# Patient Record
Sex: Male | Born: 1980 | Race: Black or African American | Hispanic: No | Marital: Single | Smoking: Current every day smoker
Health system: Southern US, Community
[De-identification: ages and names within clinical notes are randomized; demographics above are authoritative.]

## PROBLEM LIST (undated history)

## (undated) DIAGNOSIS — J9819 Other pulmonary collapse: Secondary | ICD-10-CM

## (undated) HISTORY — PX: CHEST TUBE INSERTION: SHX231

---

## 2006-04-01 ENCOUNTER — Emergency Department (HOSPITAL_COMMUNITY): Admission: EM | Admit: 2006-04-01 | Discharge: 2006-04-01 | Payer: Self-pay | Admitting: Emergency Medicine

## 2006-04-02 ENCOUNTER — Inpatient Hospital Stay (HOSPITAL_COMMUNITY): Admission: EM | Admit: 2006-04-02 | Discharge: 2006-04-09 | Payer: Self-pay | Admitting: Emergency Medicine

## 2006-04-05 ENCOUNTER — Encounter (INDEPENDENT_AMBULATORY_CARE_PROVIDER_SITE_OTHER): Payer: Self-pay | Admitting: *Deleted

## 2006-04-07 ENCOUNTER — Encounter: Payer: Self-pay | Admitting: Thoracic Surgery

## 2006-04-23 ENCOUNTER — Encounter: Admission: RE | Admit: 2006-04-23 | Discharge: 2006-04-23 | Payer: Self-pay | Admitting: Thoracic Surgery

## 2007-06-10 IMAGING — CR DG CHEST 1V PORT
1 series · 1 of 1 positions shown · non-contrast
Comparison: 04/02/06.

CLINICAL DATA: Pneumothorax.
 PORTABLE CHEST - 1 VIEW:

[view not recorded]
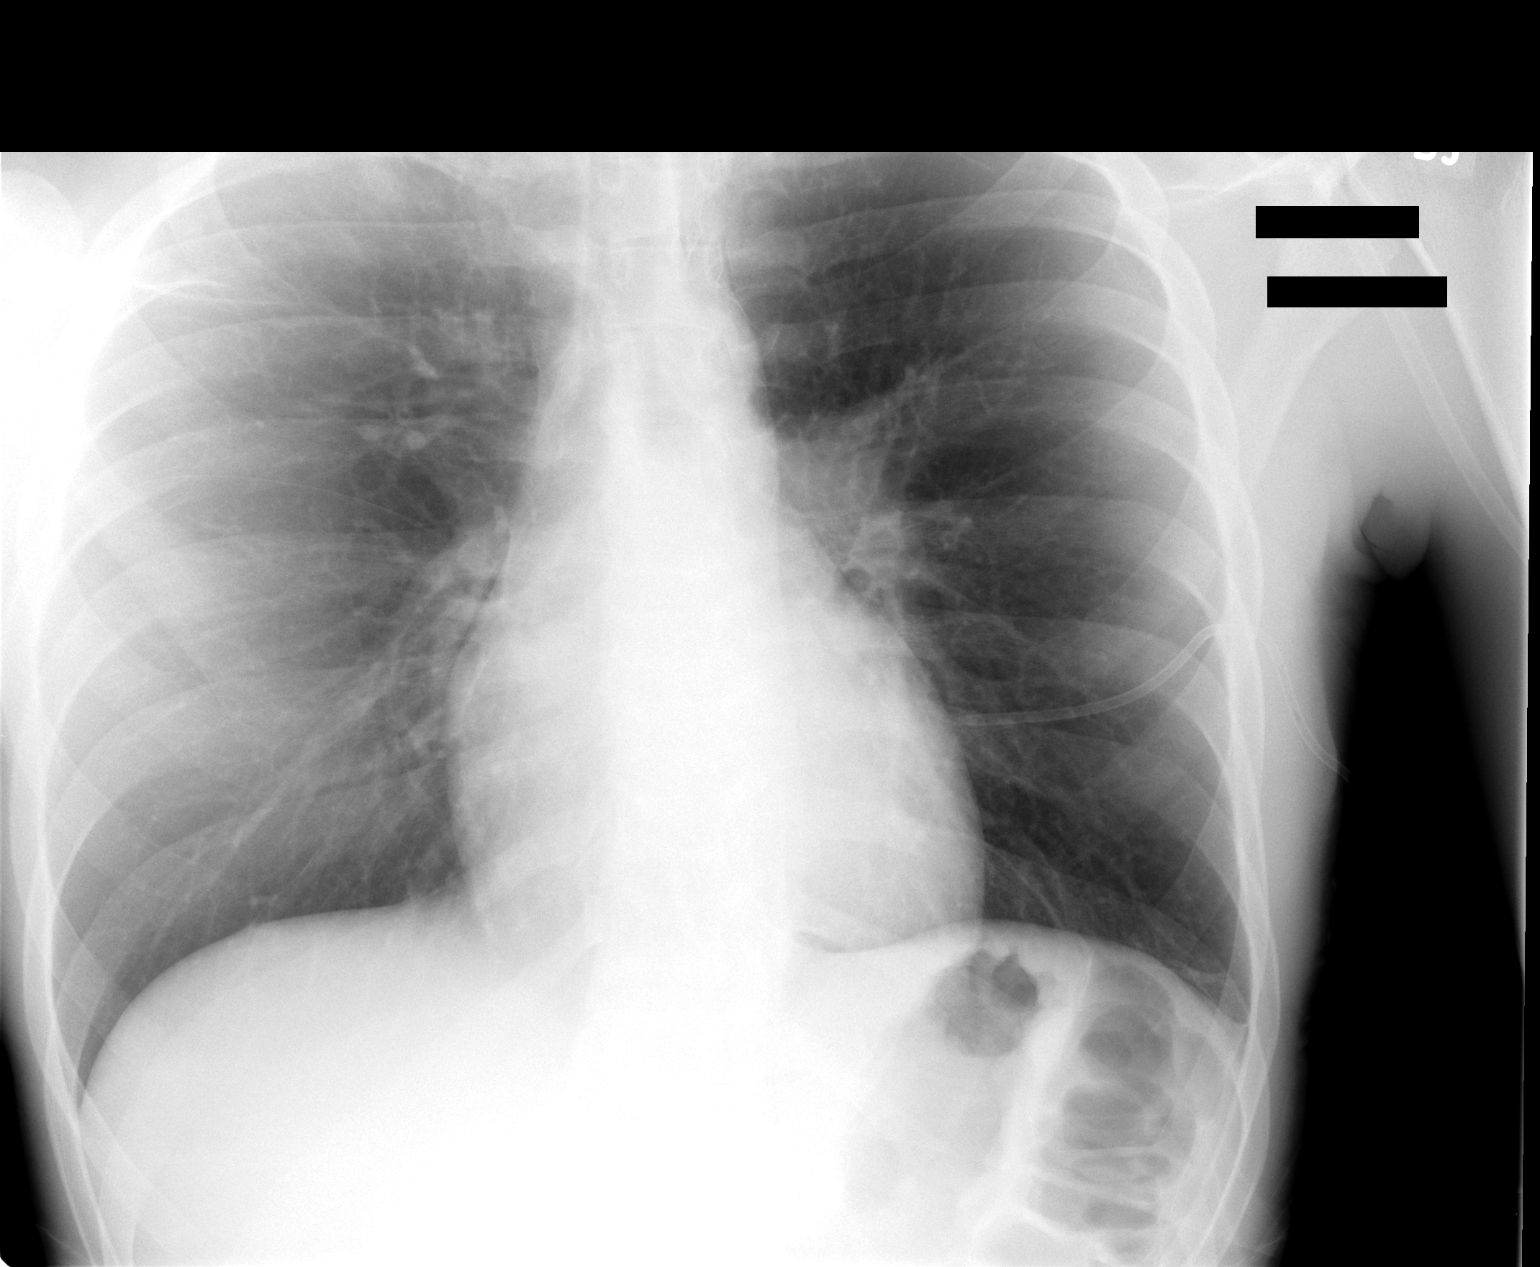

[1 of 1 positions shown; findings below may reference images not displayed]

FINDINGS: 10% left pneumothorax noted.  A small pleural drainage catheter is in place.  Heart and mediastinum appear normal with no evidence of mediastinal shift.  Right lung appears unremarkable.
IMPRESSION: 10% left pneumothorax.

## 2007-06-12 IMAGING — CR DG CHEST 1V PORT
1 series · 1 of 1 positions shown · non-contrast
Comparison: Preoperative exam earlier today.

CLINICAL DATA: Status post VATS. 
 PORTABLE CHEST ? 1 VIEW ? 2577 hours:

[view not recorded]
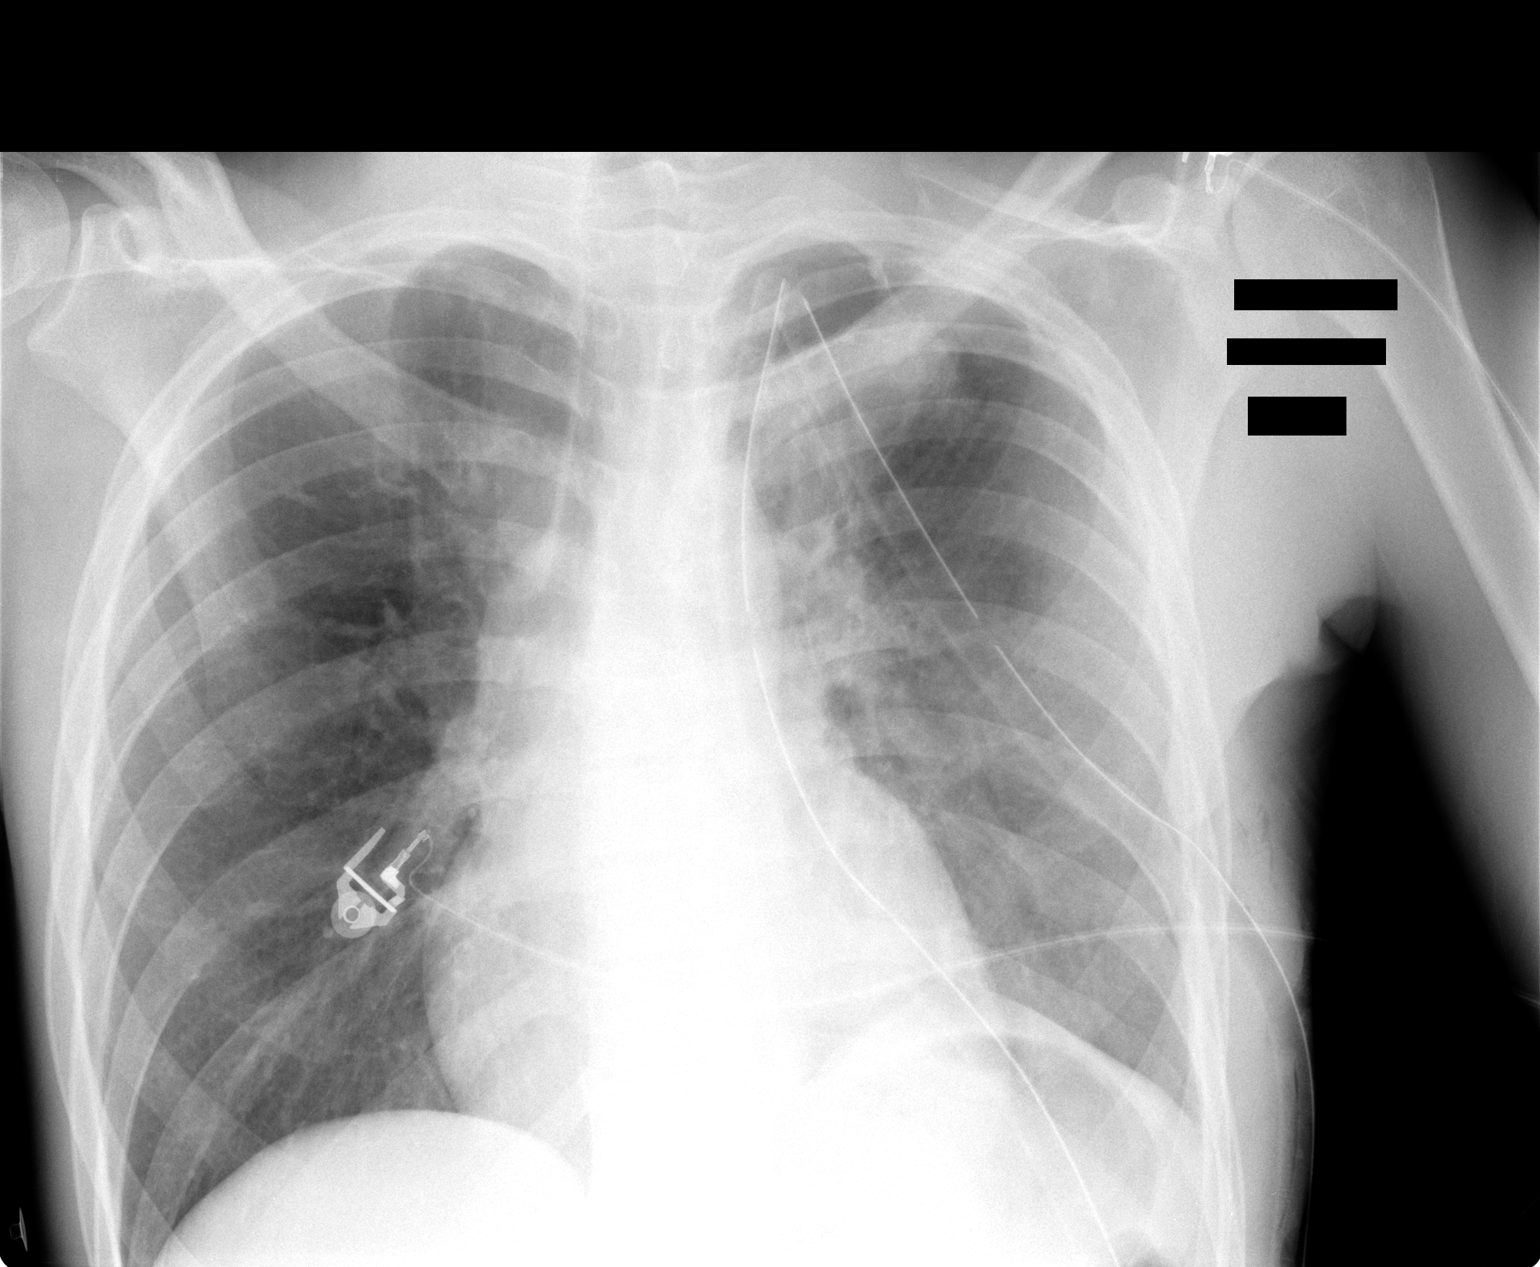

[1 of 1 positions shown; findings below may reference images not displayed]

FINDINGS: Status post left VATS.  Moderately small opacity of the left lung apex is compatible with a postoperative finding.  Possibly a small amount of hemorrhage in the parenchyma.  Two left chest tubes with tips at the apex of the left hemithorax.  No pneumothorax.
IMPRESSION: Status post left VATS.  No pneumothorax.

## 2007-06-15 IMAGING — CR DG CHEST 1V PORT
1 series · 1 of 1 positions shown · non-contrast
Comparison: none

CLINICAL DATA: Evaluate for pneumothorax status-post tube removal. 
PORTABLE CHEST - 1 VIEW ? 04/08/06 (2221 HOURS):

[view not recorded]
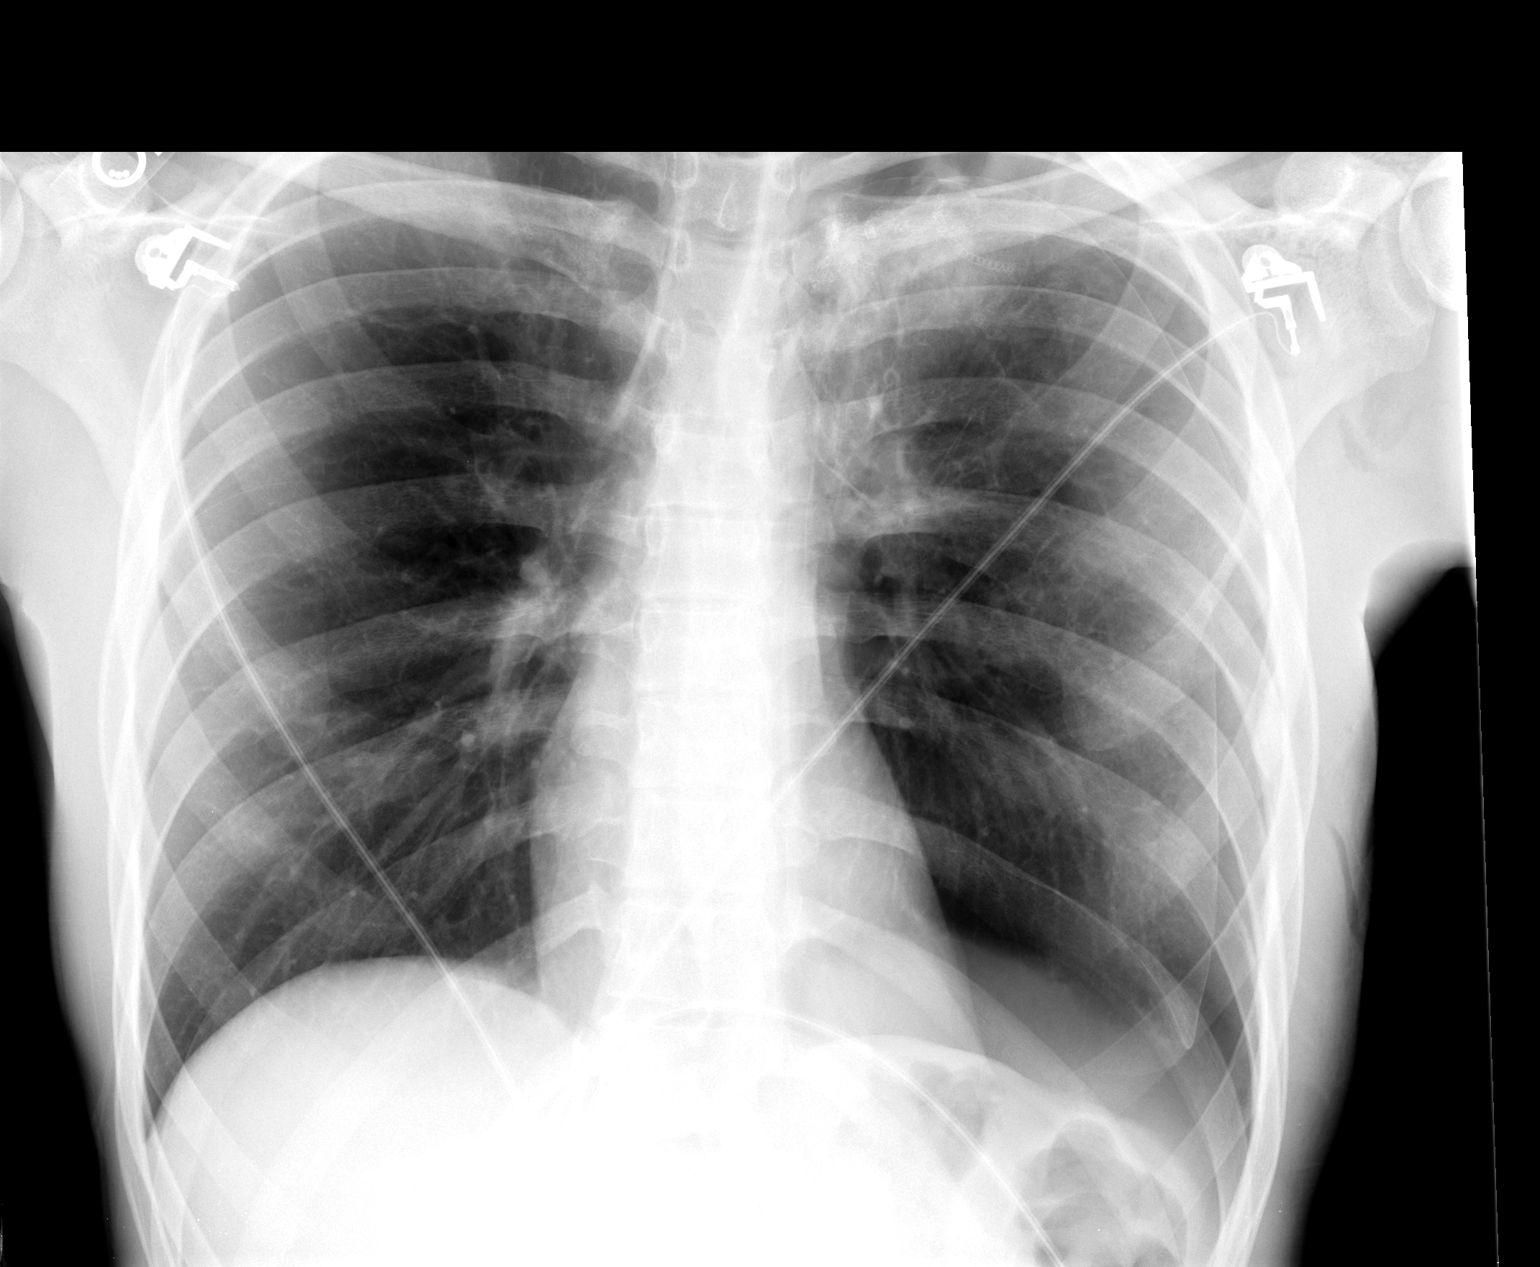

[1 of 1 positions shown; findings below may reference images not displayed]

FINDINGS: Semiupright view of the chest demonstrates removal of the chest tube with a large left pneumothorax.  Postsurgical changes involving the left upper lung region.   The right lung remains clear.  No evidence for a significant mediastinal shift.  Heart and mediastinum are grossly stable.
IMPRESSION: Moderate sized left pneumothorax following removal of the chest tube.  Pneumothorax probably measures 30% of the left hemithorax.  Heart and mediastinum are stable at this time. 
This report was called to 7701.

## 2007-06-15 IMAGING — CR DG CHEST 1V PORT
1 series · 1 of 1 positions shown · non-contrast
Comparison: 04/07/06.

CLINICAL DATA: Follow up left pneumothorax.  Status post left apical blood resection. 
 PORTABLE CHEST ? 1 VIEW:

[view not recorded]
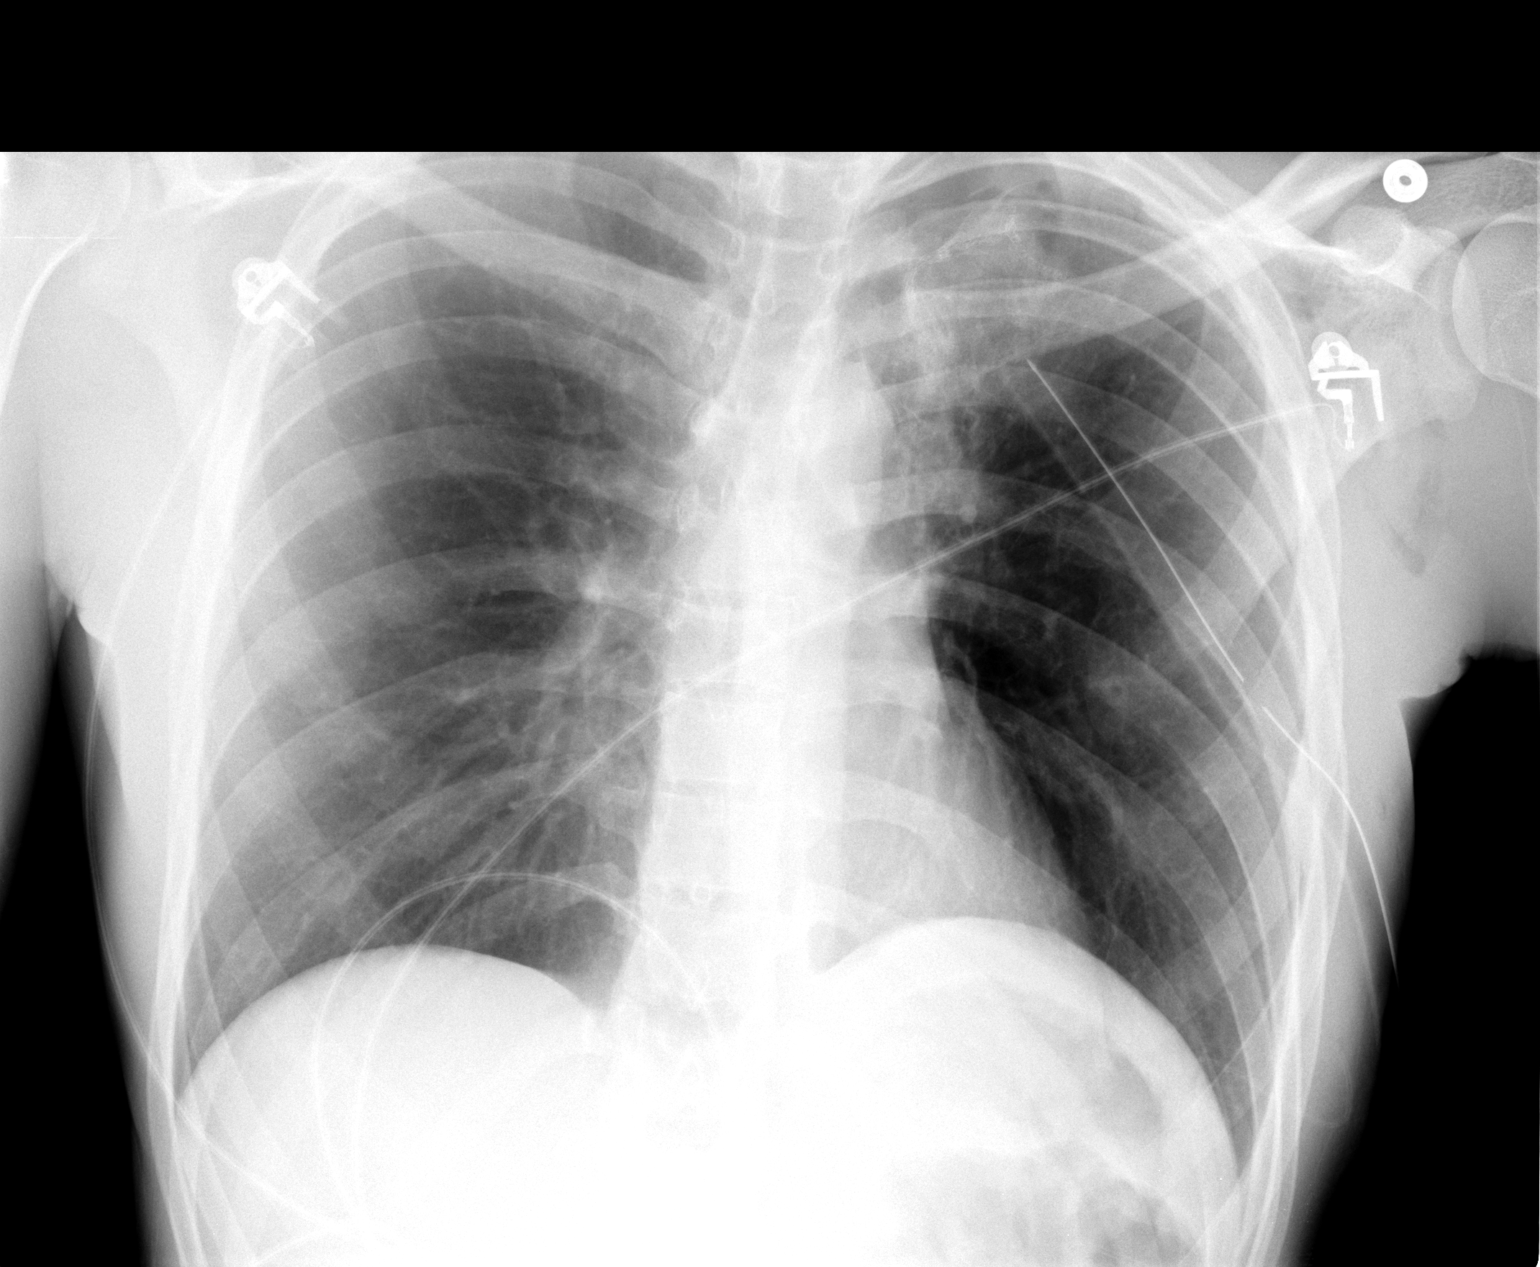

[1 of 1 positions shown; findings below may reference images not displayed]

FINDINGS: Surgical staples are again seen in the left lung apex, and the left chest tube remains in place.  There is a small approximately 5% left apical pneumothorax, which is stable.  Lungs are clear.
IMPRESSION: Stable small approximately 5% left apical pneumothorax.

## 2007-07-07 ENCOUNTER — Emergency Department (HOSPITAL_COMMUNITY): Admission: EM | Admit: 2007-07-07 | Discharge: 2007-07-07 | Payer: Self-pay | Admitting: Emergency Medicine

## 2007-12-15 ENCOUNTER — Emergency Department (HOSPITAL_COMMUNITY): Admission: EM | Admit: 2007-12-15 | Discharge: 2007-12-15 | Payer: Self-pay | Admitting: Emergency Medicine

## 2009-03-21 ENCOUNTER — Emergency Department (HOSPITAL_COMMUNITY): Admission: EM | Admit: 2009-03-21 | Discharge: 2009-03-21 | Payer: Self-pay | Admitting: Emergency Medicine

## 2010-09-24 ENCOUNTER — Emergency Department (HOSPITAL_COMMUNITY)
Admission: EM | Admit: 2010-09-24 | Discharge: 2010-09-24 | Payer: Self-pay | Source: Home / Self Care | Admitting: Emergency Medicine

## 2010-10-15 ENCOUNTER — Encounter: Payer: Self-pay | Admitting: Thoracic Surgery

## 2011-01-01 ENCOUNTER — Emergency Department (HOSPITAL_COMMUNITY)
Admission: EM | Admit: 2011-01-01 | Discharge: 2011-01-01 | Disposition: A | Discharge status: Home / Self Care | Payer: Self-pay | Attending: Emergency Medicine | Admitting: Emergency Medicine

## 2011-01-01 DIAGNOSIS — R059 Cough, unspecified: Secondary | ICD-10-CM | POA: Insufficient documentation

## 2011-01-01 DIAGNOSIS — J309 Allergic rhinitis, unspecified: Secondary | ICD-10-CM | POA: Insufficient documentation

## 2011-01-01 DIAGNOSIS — J3489 Other specified disorders of nose and nasal sinuses: Secondary | ICD-10-CM | POA: Insufficient documentation

## 2011-01-01 DIAGNOSIS — R220 Localized swelling, mass and lump, head: Secondary | ICD-10-CM | POA: Insufficient documentation

## 2011-01-01 DIAGNOSIS — R51 Headache: Secondary | ICD-10-CM | POA: Insufficient documentation

## 2011-01-01 DIAGNOSIS — R5381 Other malaise: Secondary | ICD-10-CM | POA: Insufficient documentation

## 2011-01-01 DIAGNOSIS — R070 Pain in throat: Secondary | ICD-10-CM | POA: Insufficient documentation

## 2011-01-01 DIAGNOSIS — R5383 Other fatigue: Secondary | ICD-10-CM | POA: Insufficient documentation

## 2011-01-01 DIAGNOSIS — R05 Cough: Secondary | ICD-10-CM | POA: Insufficient documentation

## 2011-01-01 DIAGNOSIS — R112 Nausea with vomiting, unspecified: Secondary | ICD-10-CM | POA: Insufficient documentation

## 2011-01-01 DIAGNOSIS — F17200 Nicotine dependence, unspecified, uncomplicated: Secondary | ICD-10-CM | POA: Insufficient documentation

## 2011-02-09 NOTE — Discharge Summary (Signed)
NAMEREGGIE, BISE              ACCOUNT NO.:  [id number]   MEDICAL RECORD NO.:  [id number]          PATIENT TYPE:  INP   LOCATION:  3309                         FACILITY:  [facility]   PHYSICIAN:  Rowe Clack, P.A.-C. DATE OF BIRTH:  [birth year]   DATE OF ADMISSION:  04/04/2006  DATE OF DISCHARGE:  04/09/2006                                 DISCHARGE SUMMARY   HISTORY OF PRESENT ILLNESS:  Patient is a 30 year old male transferred from  [facility] where he presented with a left spontaneous pneumothorax  and had a persistent air leak.  Pain started approximately Monday in the  morning of the week of admission and he presented to the emergency room but  left without being seen secondary to the prolonged wait.  He returned on  Tuesday to the emergency department and was found to have a near complete  left sided pneumothorax.  A chest tube was placed.  He was monitored and had  a persistent air leak and was felt to require the services of  a thoracic  surgeon.  He was then transferred to [facility] under the care of  Dr. Edwyna Shell for further evaluation and treatment.   PAST MEDICAL HISTORY:  None.   PAST SURGICAL HISTORY:  He had a right wrist cyst removed.   ALLERGIES:  No known drug allergies.   MEDICATIONS:  Prior to admission, none.   SOCIAL HISTORY:  Remarkable for tobacco abuse.   PHYSICAL EXAMINATION:  Please see history and physical noted at time of  admission.   HOSPITAL COURSE:  Patient was admitted.  A left video assisted thoracoscopy  was scheduled for stapling of left lung blebs.  This occurred on 04/05/2006  and the following procedure was done:  Left video assisted thoracoscopy with  resection of left apical blebs, pleurectomy and mechanical pleurodesis.  The  procedure was performed by Dr. Edwyna Shell, tolerated well and he was taken to  the post-anesthesia care unit in stable condition.   POSTOPERATIVE HOSPITAL COURSE:  The patient has done well  postoperatively.  His chest tube was discontinued on 04/08/2006.  The chest x-ray is currently  pending.  If it remains stable and he has no recurrence of his pneumothorax  tentatively he will be discharged in the morning of 04/09/2006.  He is  otherwise stable from a pulmonary viewpoint.  Oxygen saturations are 98% on  room air.  He is hemodynamically stable.  His incision related to the chest  tube and video assisted thoracoscopy is showing no evidence of infection.  From a laboratory viewpoint, the patient's hemoglobin hematocrit on  04/07/2006 are 12.7 and 38.2 respectively.  Electrolytes on same day were  sodium 133, potassium 3.8, chloride 97, CO2 29, glucose 106, BUN 4,  creatinine 1.0.   INSTRUCTIONS:  The patient received written instructions in regards to  medications, activity, diet, wound care and followup.  Followup with Dr.  Edwyna Shell on Wednesday July 25th at 4 p.m. with a chest x-ray from [facility].  Medications on discharge:  Nicotine patch as needed, for  pain Tylox one every 6  hours prn.   FINAL DIAGNOSES:  Spontaneous left pneumothorax, now status post bleb  resection and pleurodesis.  Other diagnosis as previously listed per the  history.  Condition on discharge is stable and improved.      Rowe Clack, P.A.-C.     Sherryll Burger  D:  04/08/2006  T:  04/09/2006  Job:  [id number]   cc:   Ines Bloomer, M.D.  [address]  [place]  [place] [address]

## 2011-02-09 NOTE — Consult Note (Signed)
Andrew Mccarty, MONFORTE NO.:  [id number]   MEDICAL RECORD NO.:  [id number]          PATIENT TYPE:  OBV   LOCATION:  A327                          FACILITY:  [facility]   PHYSICIAN:  Barbaraann Barthel, M.D. DATE OF BIRTH:  [birth year]   DATE OF CONSULTATION:  DATE OF DISCHARGE:                                   CONSULTATION   NOTE:  Surgery was asked to see this 30 year old black male who was seen in  the emergency room for left-sided pneumothorax.   HISTORY OF PRESENT ILLNESS:  The patient states that he had sudden onset of  left-sided chest pain yesterday.  He came to the emergency room.  He did not  wait to be attended.  He left and then returned to the emergency room the  following day with continued left-sided pain.  He was admitted into the  emergency room.  He was seen by the emergency room physician where a 10-  French chest tube was connected to a Heimlich valve and then to a pleural  vac into wall suction was installed.  A follow-up chest x-ray showed that  the left lung had almost completely reexpanded with a 10% residual  pneumothorax.  Surgery was consulted to monitor his admission as he is not  being admitted by the hospitalist for observation.   CONTRIBUTORY HISTORY:  This is the first episode of spontaneous  pneumothorax.  This is unassociated with trauma.  The patient smokes at  least a half a pack of cigarettes per day.   PAST MEDICAL HISTORY:  The patient takes no medications on a regular basis.  He has had surgery on his right wrist.  He has no known allergies.   PHYSICAL EXAMINATION:  VITAL SIGNS:  He is approximately 6 feet 4, weighs  158 pounds.  Temperature is 98.1, blood pressure 118/80, pulse rate 87,  respirations 26.  His O2 sat oximetry is 100%.  HEAD:  Normocephalic.  EYES:  Extraocular movements intact.  Pupils were round, react to light and  accommodation.  There is no conjunctive pallor or scleral injection.  Sclerae are normal  tincture.  NOSE:  Normal mucosa are moist.  NECK:  Supple and cylindrical without jugular vein distension, thyromegaly  or tracheal deviation.  His neck has no adenopathy.  CHEST:  Breath sounds are now appreciated bilaterally.  The patient has a  chest tube well placed and secured to the left side.  ABDOMEN:  Soft.  RECTAL:  Examination was deferred.  EXTREMITIES:  Within normal limits, status post right wrist surgery.   PLAN:  This patient will be admitted to my service for observation.  We will  admit him to wall suction, obtain a follow-up chest x-ray in the morning and  observing for any acute changes.      Barbaraann Barthel, M.D.  Electronically Signed     WB/MEDQ  D:  04/02/2006  T:  04/02/2006  Job:  [id number]

## 2011-02-09 NOTE — Op Note (Signed)
[name], Andrew Mccarty              ACCOUNT NO.:  [id number]   MEDICAL RECORD NO.:  [id number]          PATIENT TYPE:  INP   LOCATION:  3309                         FACILITY:  [facility]   PHYSICIAN:  Ines Bloomer, M.D. DATE OF BIRTH:  [birth year]   DATE OF PROCEDURE:  DATE OF DISCHARGE:                                 OPERATIVE REPORT   PREOPERATIVE DIAGNOSIS:  Recurrent left pneumothorax.   POSTOPERATIVE DIAGNOSIS:  Recurrent left pneumothorax.   OPERATION PERFORMED:  Left VAT resection of apical blebs, pleurectomy and  pleurodesis.   SURGEON:  D.P. Burney.   ASSESSMENT:  [name], PA-C.   ANESTHESIA:  General anesthesia.   After percutaneous insertion of all monitor lines, the patient underwent  general anesthesia, was turned to the left lateral thoracotomy position.  The dual-lumen tube was inserted.  He was prepped and draped in the usual  sterile manner.  Three trocar sites were made.  One in the 6th intercostal  space at the posterior axillary line, one in the 5th intercostal space at  the anterior axillary line, and one in the 3rd intercostal space at the  midaxillary line.  Three trocars were inserted.  The zero-degree scope was  inserted, and the patient had multiple blebs apex of his lungs.  This was  resected using the Auto Suture, green stapler, both 45 and 60 staplers  resecting a good portion of the left upper lobe to removal all of the blebs.  Then a pleurectomy was performed using Kaiserling forceps, stripping the  pleura from the apex down to about the 5th or 6th ribs medially, laterally  and superiorly.  And then a pleurodesis using a Bovie scraper was done for  the lower 5-6 ribs in a circumferential fashion.  Two chest tubes were  inserted through the lower trocar sites and tied in place with 0 silk.  The  superior trocar site was closed with 3-0 Vicryl and Dermabond for the skin.  The lung was reexpanded.  The patient is returned to the  recovery room in  stable condition.           ______________________________  Ines Bloomer, M.D.     DPB/MEDQ  D:  04/05/2006  T:  04/06/2006  Job:  [id number]   cc:   Barbaraann Barthel, M.D.  Fax: [phone]

## 2011-02-09 NOTE — Discharge Summary (Signed)
NAMEFUE, CERVENKA              ACCOUNT NO.:  [id number]   MEDICAL RECORD NO.:  [id number]          PATIENT TYPE:  INP   LOCATION:  A327                          FACILITY:  [facility]   PHYSICIAN:  Barbaraann Barthel, M.D. DATE OF BIRTH:  August 31, 1981   DATE OF ADMISSION:  04/03/2006  DATE OF DISCHARGE:  07/12/2007LH                                 DISCHARGE SUMMARY   DIAGNOSIS:  One hundred percent left-sided pneumothorax.   NOTE:  This is a 30 year old black male who is a smoker, he smokes a pack of  cigarettes per day, who had his first episode of a spontaneous pneumothorax  unassociated with trauma or any particularly vigorous physical activity.  He  represented to the emergency room where a Heimlich catheter chest tube was  placed in his left pleural cavity with resolution of his pneumothorax to  approximately 10%, and he seemed to be doing well; however, he continued to  have an air leak, and when this was taken from wall suction, the  pneumothorax increased to 25%.  I returned him to wall suction and  transferred this patient to Dr. Dennie Bible Burney's service after discussing the  above with him.   LABORATORY DATA:  His white count on admission was 5.9 with an H&H of 14.6  and 43.4.  His electrolytes were grossly within normal limits.  The patient  will be transferred by [facility], or whatever arrangements Dr. Edwyna Shell  chooses.  I have written orders for him to have an IV of normal saline at 75  cc an hour, and his Pleur-Evac connected to suction at continuous suction.  Transfer arrangements are in progress.      Barbaraann Barthel, M.D.  Electronically Signed     WB/MEDQ  D:  04/04/2006  T:  04/04/2006  Job:  [id number]   cc:   Alvin Critchley, M.D.  [address]  [place], [place] [address]   Ines Bloomer, M.D.  [address]  [place]  [place] [address]

## 2013-11-11 ENCOUNTER — Encounter (HOSPITAL_COMMUNITY): Payer: Self-pay | Admitting: Emergency Medicine

## 2013-11-11 ENCOUNTER — Emergency Department (HOSPITAL_COMMUNITY)
Admission: EM | Admit: 2013-11-11 | Discharge: 2013-11-11 | Disposition: A | Discharge status: Home / Self Care | Payer: Self-pay | Attending: Emergency Medicine | Admitting: Emergency Medicine

## 2013-11-11 ENCOUNTER — Emergency Department (HOSPITAL_COMMUNITY): Payer: Self-pay

## 2013-11-11 DIAGNOSIS — R071 Chest pain on breathing: Secondary | ICD-10-CM | POA: Insufficient documentation

## 2013-11-11 DIAGNOSIS — R0781 Pleurodynia: Secondary | ICD-10-CM

## 2013-11-11 DIAGNOSIS — Z8709 Personal history of other diseases of the respiratory system: Secondary | ICD-10-CM | POA: Insufficient documentation

## 2013-11-11 DIAGNOSIS — F17200 Nicotine dependence, unspecified, uncomplicated: Secondary | ICD-10-CM | POA: Insufficient documentation

## 2013-11-11 HISTORY — DX: Other pulmonary collapse: J98.19

## 2013-11-11 MED ORDER — IBUPROFEN 800 MG PO TABS: 800.0000 mg | ORAL_TABLET | Freq: Three times a day (TID) | ORAL | Status: DC

## 2013-11-11 NOTE — ED Provider Notes (Signed)
CSN: [id number]     Arrival date & time 11/11/13  1435 History   First MD Initiated Contact with Patient 11/11/13 1651     Chief Complaint  Patient presents with  . Chest Pain     (Consider location/radiation/quality/duration/timing/severity/associated sxs/prior Treatment) Patient is a 33 y.o. male presenting with chest pain.  Chest Pain  Pt with remote history of spontaneous pneumothorax reports onset of moderate sharp/aching L lower chest pains earlier today while at work, not associated with SOB but worse with deep breath and improved with raising L arm. No fever but has had URI symptoms recently.   Past Medical History  Diagnosis Date  . Collapsed lung    Past Surgical History  Procedure Laterality Date  . Chest tube insertion     No family history on file. History  Substance Use Topics  . Smoking status: Current Every Day Smoker  . Smokeless tobacco: Not on file  . Alcohol Use: Yes     Comment: beer every other day    Review of Systems  Cardiovascular: Positive for chest pain.   All other systems reviewed and are negative except as noted in HPI.     Allergies  Morphine and related  Home Medications  No current outpatient prescriptions on file. BP 121/85  Pulse 70  Temp(Src) 98.1 F (36.7 C) (Oral)  Resp 18  Ht 6\' 4"  (1.93 m)  Wt 155 lb (70.308 kg)  BMI 18.88 kg/m2  SpO2 100% Physical Exam  Nursing note and vitals reviewed. Constitutional: He is oriented to person, place, and time. He appears well-developed and well-nourished.  HENT:  Head: Normocephalic and atraumatic.  Eyes: EOM are normal. Pupils are equal, round, and reactive to light.  Neck: Normal range of motion. Neck supple.  Cardiovascular: Normal rate, normal heart sounds and intact distal pulses.   Pulmonary/Chest: Effort normal and breath sounds normal. No respiratory distress. He has no wheezes. He has no rales. He exhibits tenderness (L lower).  Abdominal: Bowel sounds are normal. He  exhibits no distension. There is no tenderness.  Musculoskeletal: Normal range of motion. He exhibits no edema and no tenderness.  Neurological: He is alert and oriented to person, place, and time. He has normal strength. No cranial nerve deficit or sensory deficit.  Skin: Skin is warm and dry. No rash noted.  Psychiatric: He has a normal mood and affect.    ED Course  Procedures (including critical care time) Labs Review Labs Reviewed - No data to display Imaging Review Dg Chest 2 View  11/11/2013   CLINICAL DATA:  Left-sided chest pain for 2 hr.  EXAM: CHEST  2 VIEW  COMPARISON:  09/24/2010 rib radiographs  FINDINGS: The cardiomediastinal silhouette is within normal limits. Suture material overlies the left lung apex, unchanged. The lungs are well inflated without evidence of focal airspace consolidation, edema, pleural effusion, or pneumothorax. No acute osseous abnormality is identified.  IMPRESSION: No evidence of acute abnormality in the chest.   Electronically Signed   By: Sebastian Ache   On: 11/11/2013 15:30    EKG Interpretation    Date/Time:  Wednesday November 11 2013 14:37:27 EST Ventricular Rate:  64 PR Interval:  172 QRS Duration: 84 QT Interval:  396 QTC Calculation: 408 R Axis:   80 Text Interpretation:  Normal sinus rhythm Possible Left atrial enlargement ST elevation, consider early repolarization, pericarditis, or injury Abnormal ECG No previous ECGs available Confirmed by Akera Snowberger  MD, Amyr Sluder ([id number]) on 11/11/2013 4:51:31 PM  MDM   Final diagnoses:  Pleuritic chest pain    Xray reviewed, neg for PTX. Lung sounds clear, no cardiac rub, could be mild pericarditis given EKG findings, but patient is tall and thin, so early repol is more likely. Either way, will d/c with NSAID, PCP followup and return for worsening.     Golden Gilreath B. Bernette MayersSheldon, MD 11/11/13 [id number]

## 2013-11-11 NOTE — Discharge Instructions (Signed)

## 2013-11-11 NOTE — ED Notes (Signed)
Pt reports sudden onset of chest pain since approx 1315.  Reports history of collapsed lung.  Breath sounds auscultated.  Denies any SOB.  Pt says pain gets better when he raises his left arm.

## 2015-01-18 IMAGING — CR DG CHEST 2V
2 series · 2 of 2 positions shown · non-contrast
Comparison: 09/24/2010 rib radiographs

CLINICAL DATA: Left-sided chest pain for 2 hr.

EXAM:
CHEST  2 VIEW

[view not recorded (1 of 2)]
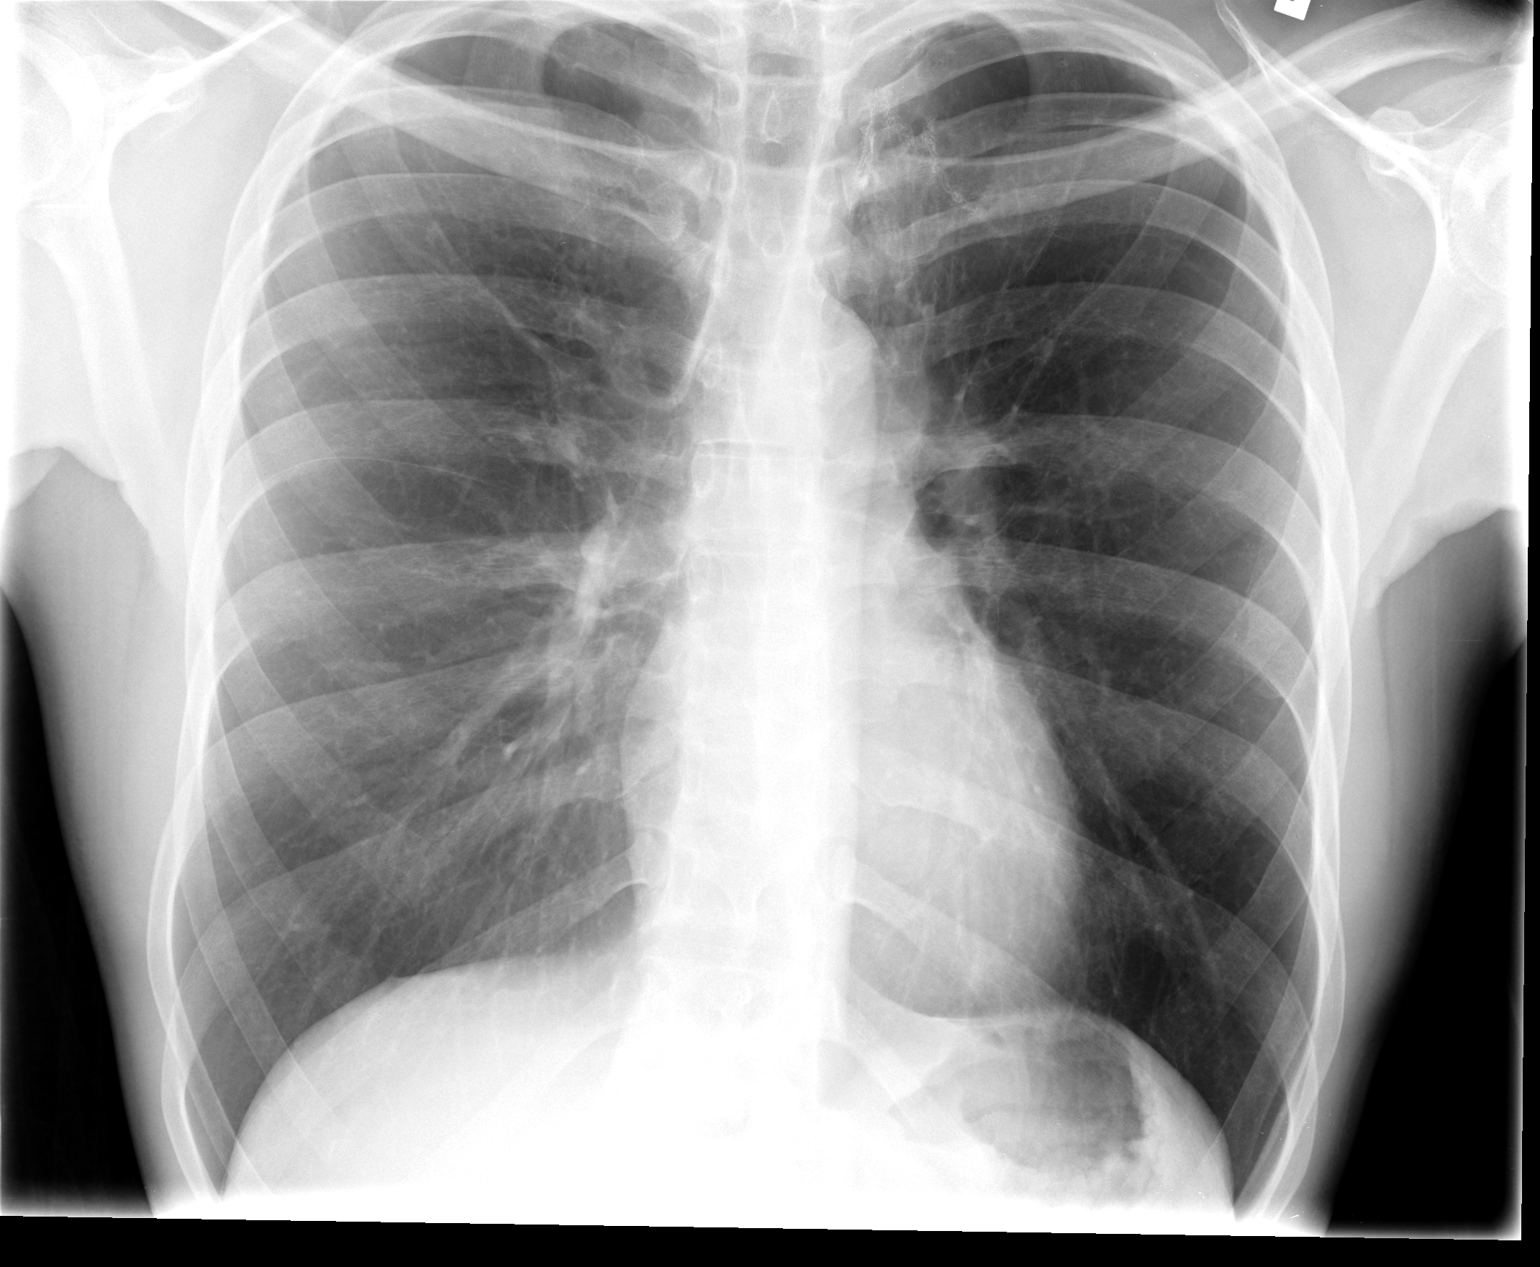

[view not recorded (2 of 2)]
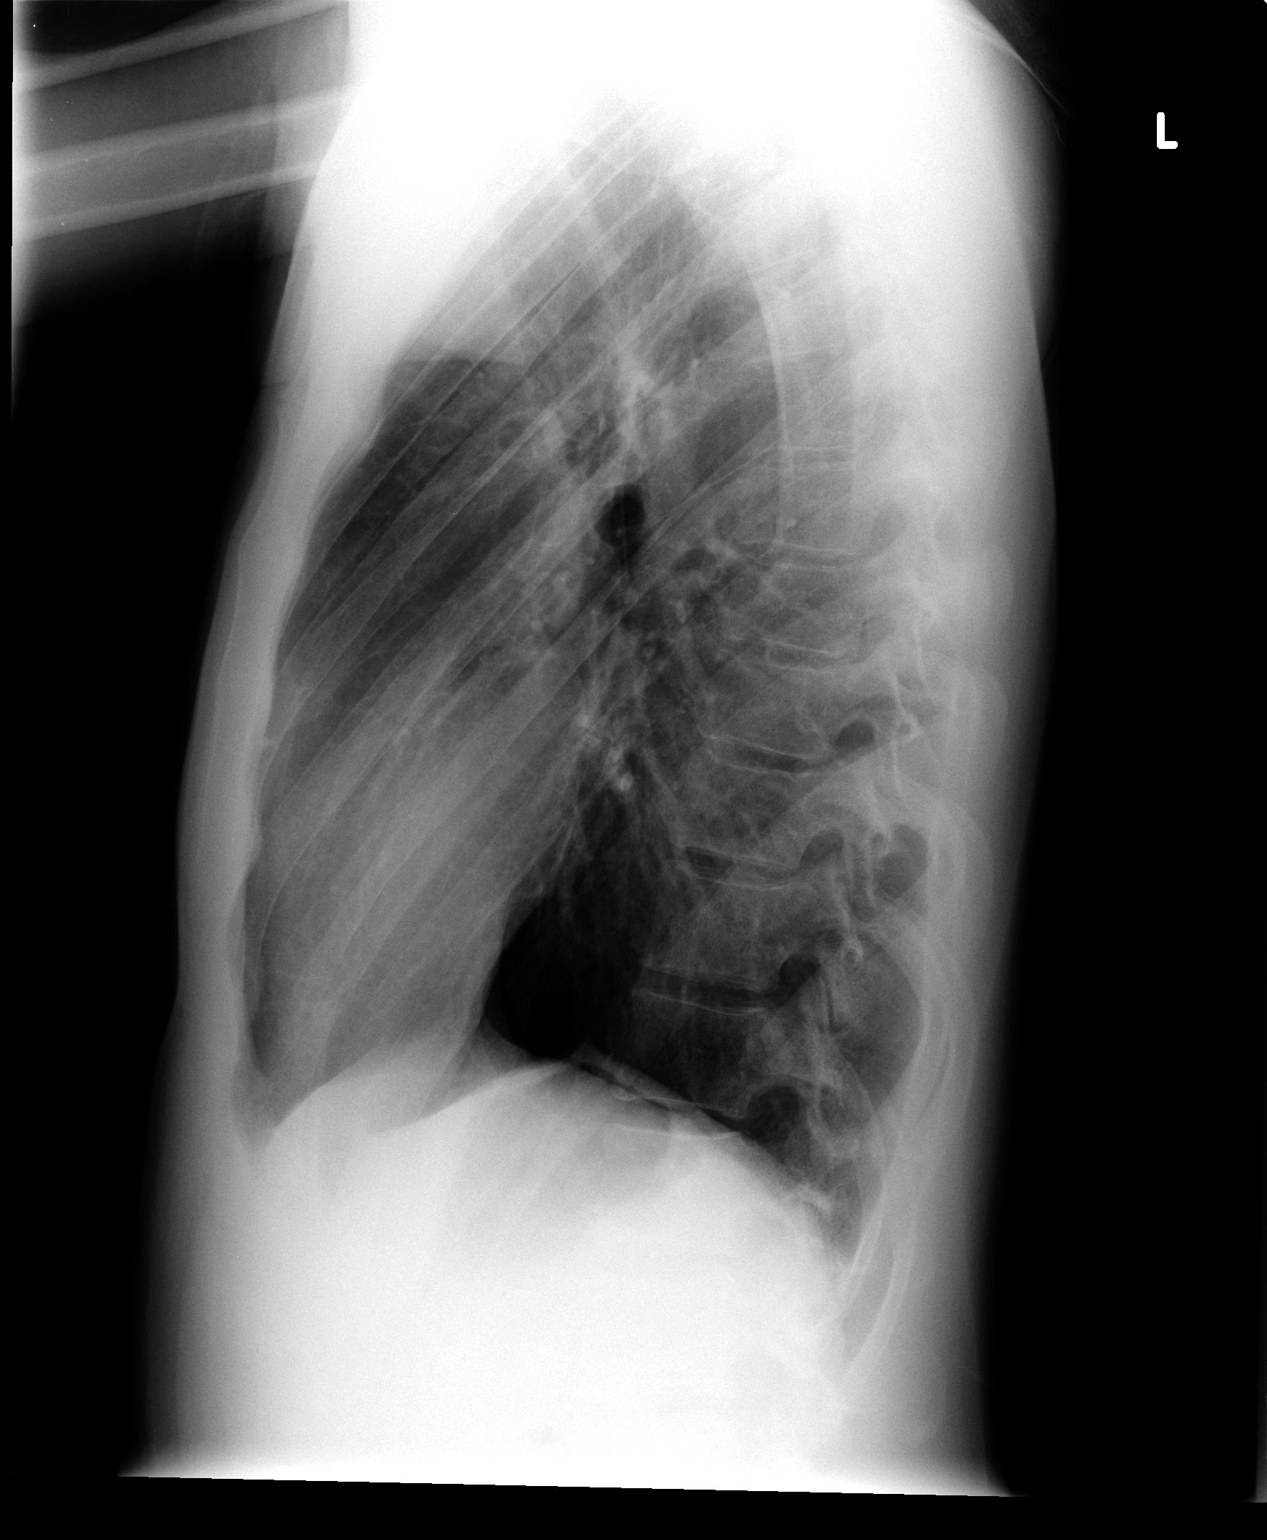

[2 of 2 positions shown; findings below may reference images not displayed]

FINDINGS: The cardiomediastinal silhouette is within normal limits. Suture
material overlies the left lung apex, unchanged. The lungs are well
inflated without evidence of focal airspace consolidation, edema,
pleural effusion, or pneumothorax. No acute osseous abnormality is
identified.
IMPRESSION: No evidence of acute abnormality in the chest.

## 2017-03-29 ENCOUNTER — Emergency Department (HOSPITAL_COMMUNITY)
Admission: EM | Admit: 2017-03-29 | Discharge: 2017-03-29 | Disposition: A | Discharge status: Home / Self Care | Payer: Self-pay | Attending: Emergency Medicine | Admitting: Emergency Medicine

## 2017-03-29 ENCOUNTER — Encounter (HOSPITAL_COMMUNITY): Payer: Self-pay | Admitting: Emergency Medicine

## 2017-03-29 DIAGNOSIS — Y939 Activity, unspecified: Secondary | ICD-10-CM | POA: Insufficient documentation

## 2017-03-29 DIAGNOSIS — W228XXA Striking against or struck by other objects, initial encounter: Secondary | ICD-10-CM | POA: Insufficient documentation

## 2017-03-29 DIAGNOSIS — Y999 Unspecified external cause status: Secondary | ICD-10-CM | POA: Insufficient documentation

## 2017-03-29 DIAGNOSIS — Y929 Unspecified place or not applicable: Secondary | ICD-10-CM | POA: Insufficient documentation

## 2017-03-29 DIAGNOSIS — F17200 Nicotine dependence, unspecified, uncomplicated: Secondary | ICD-10-CM | POA: Insufficient documentation

## 2017-03-29 DIAGNOSIS — Z885 Allergy status to narcotic agent status: Secondary | ICD-10-CM | POA: Insufficient documentation

## 2017-03-29 DIAGNOSIS — S81801A Unspecified open wound, right lower leg, initial encounter: Secondary | ICD-10-CM | POA: Insufficient documentation

## 2017-03-29 LAB — CBG MONITORING, ED: GLUCOSE-CAPILLARY: 86 mg/dL (ref 65–99)

## 2017-03-29 MED ORDER — BACITRACIN ZINC 500 UNIT/GM EX OINT
TOPICAL_OINTMENT | CUTANEOUS | Status: AC
  Filled 2017-03-29: qty 0.9

## 2017-03-29 NOTE — ED Provider Notes (Signed)
[facility]ER[facility] DEPT Provider Note   CSN: [id number] Arrival date & time: 03/29/17  1641     History   Chief Complaint Chief Complaint  Patient presents with  . Wound Infection    HPI Andrew Mccarty is a 36 y.o. male with no significant past medical history who presents today with chief complaint  wound to the right lower extremity for 2 months. He states that he was hit by a metal chair to his right shin 2 months ago, and he has had a wound to this area since then. Notes that he has cleaned it, and will apply antibiotic ointment daily. He notes that this area has "scabbed over "twice, with subsequent "rubbing off "of the scab after use of antibiotic ointment or while wearing his work uniform. He denies pain, tenderness, malodorous or abnormal drainage. States that the area will occasionally drains serosanguinous fluid. He denies numbness, tingling, or weakness. States that he is not up-to-date on his tetanus, but refuses tetanus vaccine today. Notes that he smokes approximately 10 cigarettes daily. States that his mother has a history of diabetes.   The history is provided by the patient.    Past Medical History:  Diagnosis Date  . Collapsed lung     There are no active problems to display for this patient.   Past Surgical History:  Procedure Laterality Date  . CHEST TUBE INSERTION         Home Medications    Prior to Admission medications   Medication Sig Start Date End Date Taking? Authorizing Provider  ibuprofen (ADVIL,MOTRIN) 800 MG tablet Take 1 tablet (800 mg total) by mouth 3 (three) times daily. 11/11/13   Susy Frizzle, MD    Family History History reviewed. No pertinent family history.  Social History Social History  Substance Use Topics  . Smoking status: Current Every Day Smoker  . Smokeless tobacco: Never Used  . Alcohol use Yes     Comment: beer every other day     Allergies   Morphine and related   Review of Systems Review of Systems    Constitutional: Negative for chills and fever.  Musculoskeletal: Negative for arthralgias and myalgias.  Skin: Positive for wound.  Neurological: Negative for weakness and numbness.     Physical Exam Updated Vital Signs BP (!) 134/102 (BP Location: Right Arm)   Pulse 90   Temp 98.2 F (36.8 C) (Oral)   Resp 16   Ht 6\' 4"  (1.93 m)   Wt 70.3 kg (155 lb)   SpO2 100%   BMI 18.87 kg/m   Physical Exam  Constitutional: He appears well-developed and well-nourished. No distress.  HENT:  Head: Normocephalic and atraumatic.  Eyes: Conjunctivae are normal. Right eye exhibits no discharge. Left eye exhibits no discharge.  Neck: No JVD present. No tracheal deviation present.  Cardiovascular: Normal rate and intact distal pulses.   2+ DP/PT pulses bl, negative Homan's bl   Pulmonary/Chest: Effort normal.  Abdominal: He exhibits no distension.  Musculoskeletal: Normal range of motion. He exhibits no edema or tenderness.  Neurological: He is alert. No sensory deficit.  Fluent speech, no facial droop, sensation intact to soft touch of BLE, normal gait, and patient able to heel walk and toe walk without difficulty.   Skin: Skin is warm and dry. Capillary refill takes less than 2 seconds. He is not diaphoretic.  3 cm linear wound to the right anterior shin. Healthy pink tissue centrally. No drainage, induration, purulence, fluctuance, or surrounding erythema. No  tenderness to palpation.  Psychiatric: He has a normal mood and affect. His behavior is normal.     ED Treatments / Results  Labs (all labs ordered are listed, but only abnormal results are displayed) Labs Reviewed  CBG MONITORING, ED    EKG  EKG Interpretation None       Radiology No results found.  Procedures Procedures (including critical care time)  Medications Ordered in ED Medications  bacitracin 500 UNIT/GM ointment (not administered)     Initial Impression / Assessment and Plan / ED Course  I have  reviewed the triage vital signs and the nursing notes.  Pertinent labs & imaging results that were available during my care of the patient were reviewed by me and considered in my medical decision making (see chart for details).     Patient with chronic wound of right lower extremity for 2 months. Afebrile, vital signs are stable. Neurovascularly intact. No pain noted on examination. No deformity or crepitus noted. Low suspicion of osteomyelitis, necrotizing fasciitis, cellulitis, or superimposed skin infection. Suspect repeated trauma and smoking resulting in delayed wound healing. Discussed proper wound care, and recommend follow-up with primary care physician for reevaluation in 2 weeks. Patient is adamant that he does not want his tetanus updated or imaging obtained. I feel this is reasonable at this time given physical examination is on concerning for infectious pathology, fracture, or dislocation. Discussed indications for return to the ED immediately. Pt and patient's girlfriend verbalized understanding of and agreement with plan and patient is safe for discharge home at this time.   Final Clinical Impressions(s) / ED Diagnoses   Final diagnoses:  Wound of lower extremity, right, initial encounter    New Prescriptions Discharge Medication List as of 03/29/2017  5:23 PM       Jeanie SewerFawze, Marlee Armenteros A, PA-C 03/29/17 1749    Eber HongMiller, Brian, MD 03/30/17 [phone]

## 2017-03-29 NOTE — Discharge Instructions (Signed)
Apply bacitracin cream daily. Keep the wound clean and covered. Follow up with a primary care physician for reevaluation in 2 weeks. Stop smoking, as this will impair wound healing. Return to the ED immediately if any concerning signs or symptoms develop such as fever, chills, redness, abnormal drainage.

## 2017-03-29 NOTE — ED Triage Notes (Addendum)
Patient states he hit his left lower leg 2 months ago. Complaining of open wound that has worsened since injury. States "my mama had diabetes and I'm scared I might have it and that's why it won't heal up." CBG 86 in triage.

## 2018-05-11 ENCOUNTER — Emergency Department (HOSPITAL_COMMUNITY)
Admission: EM | Admit: 2018-05-11 | Discharge: 2018-05-11 | Disposition: A | Discharge status: Home / Self Care | Payer: Self-pay | Attending: Emergency Medicine | Admitting: Emergency Medicine

## 2018-05-11 ENCOUNTER — Encounter (HOSPITAL_COMMUNITY): Payer: Self-pay | Admitting: *Deleted

## 2018-05-11 ENCOUNTER — Other Ambulatory Visit: Payer: Self-pay

## 2018-05-11 DIAGNOSIS — K0889 Other specified disorders of teeth and supporting structures: Secondary | ICD-10-CM | POA: Insufficient documentation

## 2018-05-11 DIAGNOSIS — Z5321 Procedure and treatment not carried out due to patient leaving prior to being seen by health care provider: Secondary | ICD-10-CM | POA: Insufficient documentation

## 2018-05-11 NOTE — ED Triage Notes (Signed)
Pt c/o lower left dental pain x 2 days. Denies fever.

## 2018-05-13 NOTE — ED Notes (Signed)
Follow up call made  No answer  05/13/18  [id number] s Claudine Stallings rn

## 2021-09-30 ENCOUNTER — Emergency Department (HOSPITAL_COMMUNITY): Payer: Self-pay

## 2021-09-30 ENCOUNTER — Emergency Department (HOSPITAL_COMMUNITY)
Admission: EM | Admit: 2021-09-30 | Discharge: 2021-09-30 | Disposition: A | Discharge status: Home / Self Care | Payer: Self-pay | Attending: Emergency Medicine | Admitting: Emergency Medicine

## 2021-09-30 ENCOUNTER — Encounter (HOSPITAL_COMMUNITY): Payer: Self-pay

## 2021-09-30 DIAGNOSIS — E119 Type 2 diabetes mellitus without complications: Secondary | ICD-10-CM | POA: Insufficient documentation

## 2021-09-30 DIAGNOSIS — Z7982 Long term (current) use of aspirin: Secondary | ICD-10-CM | POA: Insufficient documentation

## 2021-09-30 DIAGNOSIS — M940 Chondrocostal junction syndrome [Tietze]: Secondary | ICD-10-CM | POA: Insufficient documentation

## 2021-09-30 DIAGNOSIS — I119 Hypertensive heart disease without heart failure: Secondary | ICD-10-CM | POA: Insufficient documentation

## 2021-09-30 DIAGNOSIS — I2510 Atherosclerotic heart disease of native coronary artery without angina pectoris: Secondary | ICD-10-CM | POA: Insufficient documentation

## 2021-09-30 LAB — CBC
HCT: 38.4 % — ABNORMAL LOW (ref 39.0–52.0)
Hemoglobin: 12.8 g/dL — ABNORMAL LOW (ref 13.0–17.0)
MCH: 30 pg (ref 26.0–34.0)
MCHC: 33.3 g/dL (ref 30.0–36.0)
MCV: 90.1 fL (ref 80.0–100.0)
Platelets: 266 10*3/uL (ref 150–400)
RBC: 4.26 MIL/uL (ref 4.22–5.81)
RDW: 13.8 % (ref 11.5–15.5)
WBC: 11.4 10*3/uL — ABNORMAL HIGH (ref 4.0–10.5)
nRBC: 0 % (ref 0.0–0.2)

## 2021-09-30 LAB — BASIC METABOLIC PANEL
Anion gap: 9 (ref 5–15)
BUN: <5 mg/dL — ABNORMAL LOW (ref 6–20)
CO2: 28 mmol/L (ref 22–32)
Calcium: 9.1 mg/dL (ref 8.9–10.3)
Chloride: 100 mmol/L (ref 98–111)
Creatinine, Ser: 0.73 mg/dL (ref 0.61–1.24)
GFR, Estimated: >60 mL/min (ref >60)
Glucose, Bld: 68 mg/dL — ABNORMAL LOW (ref 70–99)
Potassium: 4.8 mmol/L (ref 3.5–5.1)
Sodium: 137 mmol/L (ref 135–145)

## 2021-09-30 MED ORDER — IBUPROFEN 800 MG PO TABS
800.0000 mg | ORAL_TABLET | Freq: Once | ORAL | Status: AC
  Administered 2021-09-30: 800 mg via ORAL
  Filled 2021-09-30: qty 1

## 2021-09-30 NOTE — ED Notes (Signed)
Patient trembling after blood draw. Patient states "I'm terrified of needles and I shouldn't have looked." Patient comforted. Patient's bed repositioned and TV turned on for distraction.EDPA aware of patient's phobia of needles and notified lab drew blood before IV access gained. Per EDPa no IV access needed at this time.

## 2021-09-30 NOTE — Discharge Instructions (Signed)
Was a pleasure taking care of you today!  Your chest x-ray did not show pneumonia or pneumothorax.  Your labs did not show infection.  You may take over-the-counter 600 mg ibuprofen every 6 hours as needed for pain.  You may apply ice or heat to the affected area for up to 15 minutes at a time.  Ensure to place a barrier between your skin and the ice/heat.  If you do not have a primary care provider, you may follow-up with [facility] and [facility] as needed.  You may return to the ED if you are experiencing increasing/worsening chest pain at rest, trouble breathing at rest, worsening symptoms.

## 2021-09-30 NOTE — ED Provider Notes (Signed)
[facility] EMERGENCY DEPARTMENT Provider Note   CSN: [id number] Arrival date & time: 09/30/21  1000     History  Chief Complaint  Patient presents with   Chest Pain    RIGHT SIDE    Andrew Mccarty is a 41 y.o. male with a past medical history of pneumothorax (left side, 20 years ago) who presents to the ED complaining of right-sided chest pain onset this morning.  Patient notes that his right-sided chest pain was gradual in nature.  He works at [employer].  Burgess Estelle he was working on his moped and did a lot of bending over prior to the onset of his symptoms.  He has tried aspirin with no relief of his symptoms.  Denies shortness of breath, fever, chills, nasal congestion, rhinorrhea, abdominal pain, nausea, vomiting.  Patient still has his gallbladder.  Denies history of MI, CAD, catheterization, hypertension, diabetes.  The history is provided by the patient. No language interpreter was used.  Chest Pain Pain location:  R chest Pain radiates to:  Does not radiate Pain severity:  Mild Onset quality:  Gradual Timing:  Intermittent Progression:  Unchanged Chronicity:  New Context comment:  Deep breathing, coughing, laughing Relieved by:  Nothing Worsened by:  Coughing and deep breathing Ineffective treatments:  Aspirin Associated symptoms: no abdominal pain, no fever, no nausea, no shortness of breath and no vomiting   Risk factors: smoking   Risk factors: no coronary artery disease, no diabetes mellitus, no high cholesterol, no hypertension, no immobilization, no prior DVT/PE and no surgery       Home Medications Prior to Admission medications   Medication Sig Start Date End Date Taking? Authorizing Provider  ibuprofen (ADVIL,MOTRIN) 800 MG tablet Take 1 tablet (800 mg total) by mouth 3 (three) times daily. 11/11/13   Pollyann Savoy, MD      Allergies    Morphine and related    Review of Systems   Review of Systems  Constitutional:  Negative for chills and fever.   HENT:  Negative for congestion and rhinorrhea.   Respiratory:  Negative for shortness of breath.   Cardiovascular:  Positive for chest pain.  Gastrointestinal:  Negative for abdominal pain, nausea and vomiting.  Skin:  Negative for rash.  All other systems reviewed and are negative.  Physical Exam Updated Vital Signs BP 134/88    Pulse 76    Temp 98.5 F (36.9 C) (Oral)    Resp 19    Ht 6\' 4"  (1.93 m)    Wt 70.3 kg    SpO2 97%    BMI 18.87 kg/m  Physical Exam Vitals and nursing note reviewed.  Constitutional:      General: He is not in acute distress.    Appearance: He is not diaphoretic.  HENT:     Head: Normocephalic and atraumatic.     Mouth/Throat:     Pharynx: No oropharyngeal exudate.  Eyes:     General: No scleral icterus.    Conjunctiva/sclera: Conjunctivae normal.  Cardiovascular:     Rate and Rhythm: Normal rate and regular rhythm.     Pulses: Normal pulses.     Heart sounds: Normal heart sounds.  Pulmonary:     Effort: Pulmonary effort is normal. No respiratory distress.     Breath sounds: Normal breath sounds. No wheezing.  Chest:     Chest wall: No deformity, swelling, tenderness or crepitus.     Comments: No chest wall tenderness to palpation.  No obvious deformities,  crepitus, step-offs.  No overlying skin changes. Abdominal:     General: Bowel sounds are normal.     Palpations: Abdomen is soft. There is no mass.     Tenderness: There is no abdominal tenderness. There is no guarding or rebound. Negative signs include Murphy's sign.     Comments: Negative Murphy sign.  Musculoskeletal:        General: Normal range of motion.     Cervical back: Normal range of motion and neck supple.  Skin:    General: Skin is warm and dry.  Neurological:     Mental Status: He is alert.  Psychiatric:        Behavior: Behavior normal.    ED Results / Procedures / Treatments   Labs (all labs ordered are listed, but only abnormal results are displayed) Labs Reviewed   BASIC METABOLIC PANEL - Abnormal; Notable for the following components:      Result Value   Glucose, Bld 68 (*)    BUN <5 (*)    All other components within normal limits  CBC - Abnormal; Notable for the following components:   WBC 11.4 (*)    Hemoglobin 12.8 (*)    HCT 38.4 (*)    All other components within normal limits    EKG EKG Interpretation  Date/Time:  Saturday September 30 2021 10:50:49 EST Ventricular Rate:  72 PR Interval:  168 QRS Duration: 85 QT Interval:  413 QTC Calculation: 452 R Axis:   82 Text Interpretation: Sinus rhythm Consider left ventricular hypertrophy Abnrm T, consider ischemia, anterolateral lds ST elev, probable normal early repol pattern No significant change since last tracing Confirmed by Benjiman Core [phone]) on 09/30/2021 11:56:29 AM  Radiology DG Chest Port 1 View  Result Date: 09/30/2021 CLINICAL DATA:  Right side chest pain, pt has a history of Pneumothorax of left side about 20 yrs ago and said it feels the same. EXAM: PORTABLE CHEST 1 VIEW COMPARISON:  11/11/2013. FINDINGS: Heart, mediastinum hila are unremarkable. Lungs are hyperexpanded. There stable post surgical changes with pulmonary anastomosis staples near the left apex. Changes of emphysema/blebs are noted in the apices. Lungs are clear. No pleural effusion. No pneumothorax. Skeletal structures are grossly intact. IMPRESSION: 1. No acute cardiopulmonary disease.  No evidence of a pneumothorax. Electronically Signed   By: Amie Portland M.D.   On: 09/30/2021 11:01    Procedures Procedures    Medications Ordered in ED Medications  ibuprofen (ADVIL) tablet 800 mg (has no administration in time range)    ED Course/ Medical Decision Making/ A&P Clinical Course as of 09/30/21 1211  Sat Sep 30, 2021  1154 Patient reevaluated and noted with lab and imaging findings. Discussed discharge treatment plan. Pt agreeable at this time.  [SB]    Clinical Course User Index [SB] Matsuko Kretz A,  PA-C                           Medical Decision Making  Patient presents to the ED with right sided chest pain since this morning. Pt with history of Left PTX 20 years ago. No prior history of MI, catheterization, DVT/PE. Differential diagnosis includes ACS, aortic dissection, pneumothorax, PE, PNA, cholecystitis, costochondritis. On exam patient without chest wall TTP. Lungs CTAB. Otherwise, no acute cardiovascular, respiratory, abdominal findings. Vital signs stable, patient not hypoxic or tachycardic.   EKG:  EKG without acute ST/T changes.  EKG with LVH, unchanged from priors.  Labs:  CBC with mildly elevated WBC at 11.4. BMP unremarkable.  Imaging:  I ordered imaging studies including chest xray I independently visualized and interpreted imaging which showed no acute pneumonia or pneumothorax. I agree with the radiologist interpretation  Medications:  Patient given ibuprofen prior to discharge.   EKG without acute ST/T changes, chest x-ray negative, low suspicion for ACS at this time. Chest x-ray without acute findings, vital signs stable, doubt aortic dissection, pneumonia, or pneumothorax at this time.  Doubt cholecystitis at this time, pt afebrile and negative murphys sign. No risk factors for PE, no unilateral lower extremity swelling, malignancy history, HRT, OCP, recent immobilizations, surgery.     Disposition: Patient presentation is suspicious for costochondritis.  Patient given dose of ibuprofen in the ED prior to discharge.  After consideration of the diagnostic results, I feel that the patent would benefit from discharge home with supportive care measures. Case discussed with attending who agrees with discharge treatment plan at this time. Supportive care and strict return precautions discussed with patient at bedside.  Instructed the patient to follow-up with primary care provider as needed.  Patient acknowledges and verbalized understanding.  Patient appears safe for  discharge at this time. Follow-up as indicated in discharge paperwork.  This chart was dictated using voice recognition software, Dragon. Despite the best efforts of this provider to proofread and correct errors, errors may still occur which can change documentation meaning.  Final Clinical Impression(s) / ED Diagnoses Final diagnoses:  Costochondritis    Rx / DC Orders ED Discharge Orders     None         Deeana Atwater A, PA-C 09/30/21 1259    Benjiman CorePickering, Nathan, MD 10/02/21 [id number]

## 2021-09-30 NOTE — ED Triage Notes (Signed)
Right side chest pain, pt has a history of Pneumothorax of left side said it feels the same.  Denies injury

## 2021-10-05 ENCOUNTER — Emergency Department (HOSPITAL_COMMUNITY)
Admission: EM | Admit: 2021-10-05 | Discharge: 2021-10-05 | Discharge status: Against Medical Advice | Payer: Medicaid Other | Source: Home / Self Care

## 2021-10-16 ENCOUNTER — Emergency Department (HOSPITAL_COMMUNITY): Payer: Self-pay

## 2021-10-16 ENCOUNTER — Encounter (HOSPITAL_COMMUNITY): Payer: Self-pay | Admitting: *Deleted

## 2021-10-16 ENCOUNTER — Inpatient Hospital Stay (HOSPITAL_COMMUNITY)
Admission: EM | Admit: 2021-10-16 | Discharge: 2021-10-25 | DRG: 871 | Disposition: A | Discharge status: Home / Self Care | Payer: Self-pay | Attending: Student | Admitting: Student

## 2021-10-16 DIAGNOSIS — A419 Sepsis, unspecified organism: Principal | ICD-10-CM | POA: Diagnosis present

## 2021-10-16 DIAGNOSIS — E871 Hypo-osmolality and hyponatremia: Secondary | ICD-10-CM | POA: Diagnosis present

## 2021-10-16 DIAGNOSIS — J181 Lobar pneumonia, unspecified organism: Secondary | ICD-10-CM | POA: Diagnosis present

## 2021-10-16 DIAGNOSIS — Z28310 Unvaccinated for covid-19: Secondary | ICD-10-CM

## 2021-10-16 DIAGNOSIS — E440 Moderate protein-calorie malnutrition: Secondary | ICD-10-CM | POA: Insufficient documentation

## 2021-10-16 DIAGNOSIS — R61 Generalized hyperhidrosis: Secondary | ICD-10-CM

## 2021-10-16 DIAGNOSIS — F17200 Nicotine dependence, unspecified, uncomplicated: Secondary | ICD-10-CM | POA: Diagnosis present

## 2021-10-16 DIAGNOSIS — R058 Other specified cough: Secondary | ICD-10-CM

## 2021-10-16 DIAGNOSIS — R636 Underweight: Secondary | ICD-10-CM | POA: Diagnosis present

## 2021-10-16 DIAGNOSIS — R6510 Systemic inflammatory response syndrome (SIRS) of non-infectious origin without acute organ dysfunction: Secondary | ICD-10-CM | POA: Diagnosis present

## 2021-10-16 DIAGNOSIS — Z20822 Contact with and (suspected) exposure to covid-19: Secondary | ICD-10-CM | POA: Diagnosis present

## 2021-10-16 DIAGNOSIS — E739 Lactose intolerance, unspecified: Secondary | ICD-10-CM | POA: Diagnosis present

## 2021-10-16 DIAGNOSIS — R634 Abnormal weight loss: Secondary | ICD-10-CM

## 2021-10-16 DIAGNOSIS — F17210 Nicotine dependence, cigarettes, uncomplicated: Secondary | ICD-10-CM | POA: Diagnosis present

## 2021-10-16 DIAGNOSIS — Z8249 Family history of ischemic heart disease and other diseases of the circulatory system: Secondary | ICD-10-CM

## 2021-10-16 DIAGNOSIS — Z681 Body mass index (BMI) 19 or less, adult: Secondary | ICD-10-CM

## 2021-10-16 DIAGNOSIS — D75838 Other thrombocytosis: Secondary | ICD-10-CM | POA: Diagnosis present

## 2021-10-16 DIAGNOSIS — R042 Hemoptysis: Secondary | ICD-10-CM | POA: Diagnosis present

## 2021-10-16 DIAGNOSIS — J189 Pneumonia, unspecified organism: Secondary | ICD-10-CM

## 2021-10-16 LAB — COMPREHENSIVE METABOLIC PANEL
ALT: 77 U/L — ABNORMAL HIGH (ref 0–44)
AST: 95 U/L — ABNORMAL HIGH (ref 15–41)
Albumin: 2.4 g/dL — ABNORMAL LOW (ref 3.5–5.0)
Alkaline Phosphatase: 73 U/L (ref 38–126)
Anion gap: 11 (ref 5–15)
BUN: 10 mg/dL (ref 6–20)
CO2: 24 mmol/L (ref 22–32)
Calcium: 8.1 mg/dL — ABNORMAL LOW (ref 8.9–10.3)
Chloride: 87 mmol/L — ABNORMAL LOW (ref 98–111)
Creatinine, Ser: 0.74 mg/dL (ref 0.61–1.24)
GFR, Estimated: >60 mL/min (ref >60)
Glucose, Bld: 135 mg/dL — ABNORMAL HIGH (ref 70–99)
Potassium: 3.5 mmol/L (ref 3.5–5.1)
Sodium: 122 mmol/L — ABNORMAL LOW (ref 135–145)
Total Bilirubin: 0.4 mg/dL (ref 0.3–1.2)
Total Protein: 7.6 g/dL (ref 6.5–8.1)

## 2021-10-16 LAB — TROPONIN I (HIGH SENSITIVITY)
Troponin I (High Sensitivity): 7 ng/L (ref <18)
Troponin I (High Sensitivity): 7 ng/L (ref <18)

## 2021-10-16 LAB — CBC WITH DIFFERENTIAL/PLATELET
Abs Immature Granulocytes: 0.35 10*3/uL — ABNORMAL HIGH (ref 0.00–0.07)
Basophils Absolute: 0.1 10*3/uL (ref 0.0–0.1)
Basophils Relative: 1 %
Eosinophils Absolute: 0 10*3/uL (ref 0.0–0.5)
Eosinophils Relative: 0 %
HCT: 33.7 % — ABNORMAL LOW (ref 39.0–52.0)
Hemoglobin: 12 g/dL — ABNORMAL LOW (ref 13.0–17.0)
Immature Granulocytes: 2 %
Lymphocytes Relative: 7 %
Lymphs Abs: 1.2 10*3/uL (ref 0.7–4.0)
MCH: 29.3 pg (ref 26.0–34.0)
MCHC: 35.6 g/dL (ref 30.0–36.0)
MCV: 82.2 fL (ref 80.0–100.0)
Monocytes Absolute: 1.7 10*3/uL — ABNORMAL HIGH (ref 0.1–1.0)
Monocytes Relative: 9 %
Neutro Abs: 14.8 10*3/uL — ABNORMAL HIGH (ref 1.7–7.7)
Neutrophils Relative %: 81 %
Platelets: 604 10*3/uL — ABNORMAL HIGH (ref 150–400)
RBC: 4.1 MIL/uL — ABNORMAL LOW (ref 4.22–5.81)
RDW: 12.6 % (ref 11.5–15.5)
WBC: 18.1 10*3/uL — ABNORMAL HIGH (ref 4.0–10.5)
nRBC: 0 % (ref 0.0–0.2)

## 2021-10-16 LAB — PROTIME-INR
INR: 1.1 (ref 0.8–1.2)
Prothrombin Time: 13.9 seconds (ref 11.4–15.2)

## 2021-10-16 LAB — RESP PANEL BY RT-PCR (FLU A&B, COVID) ARPGX2
Influenza A by PCR: NEGATIVE
Influenza B by PCR: NEGATIVE
SARS Coronavirus 2 by RT PCR: NEGATIVE

## 2021-10-16 LAB — LACTIC ACID, PLASMA
Lactic Acid, Venous: 1.5 mmol/L (ref 0.5–1.9)
Lactic Acid, Venous: 1.9 mmol/L (ref 0.5–1.9)

## 2021-10-16 LAB — APTT: aPTT: 33 seconds (ref 24–36)

## 2021-10-16 MED ORDER — LACTATED RINGERS IV SOLN: INTRAVENOUS | Status: AC

## 2021-10-16 MED ORDER — HEPARIN SODIUM (PORCINE) 5000 UNIT/ML IJ SOLN
5000.0000 [IU] | Freq: Three times a day (TID) | INTRAMUSCULAR | Status: DC
  Administered 2021-10-17 – 2021-10-25 (×23): 5000 [IU] via SUBCUTANEOUS
  Filled 2021-10-16 (×24): qty 1

## 2021-10-16 MED ORDER — OXYCODONE HCL 5 MG PO TABS
5.0000 mg | ORAL_TABLET | ORAL | Status: DC | PRN
  Administered 2021-10-17 – 2021-10-25 (×25): 5 mg via ORAL
  Filled 2021-10-16 (×25): qty 1

## 2021-10-16 MED ORDER — ACETAMINOPHEN 325 MG PO TABS
650.0000 mg | ORAL_TABLET | Freq: Four times a day (QID) | ORAL | Status: DC | PRN
  Administered 2021-10-17 – 2021-10-22 (×5): 650 mg via ORAL
  Filled 2021-10-16 (×5): qty 2

## 2021-10-16 MED ORDER — ONDANSETRON HCL 4 MG/2ML IJ SOLN: 4.0000 mg | Freq: Four times a day (QID) | INTRAMUSCULAR | Status: DC | PRN

## 2021-10-16 MED ORDER — VANCOMYCIN HCL 750 MG/150ML IV SOLN
750.0000 mg | Freq: Three times a day (TID) | INTRAVENOUS | Status: DC
  Administered 2021-10-17 (×2): 750 mg via INTRAVENOUS
  Filled 2021-10-16 (×4): qty 150

## 2021-10-16 MED ORDER — VANCOMYCIN HCL IN DEXTROSE 1-5 GM/200ML-% IV SOLN
1000.0000 mg | Freq: Once | INTRAVENOUS | Status: AC
  Administered 2021-10-16: 1000 mg via INTRAVENOUS
  Filled 2021-10-16: qty 200

## 2021-10-16 MED ORDER — ALBUTEROL SULFATE (2.5 MG/3ML) 0.083% IN NEBU
3.0000 mL | INHALATION_SOLUTION | RESPIRATORY_TRACT | Status: DC | PRN
  Administered 2021-10-23 (×3): 3 mL via RESPIRATORY_TRACT
  Filled 2021-10-16 (×2): qty 3

## 2021-10-16 MED ORDER — SODIUM CHLORIDE 0.9 % IV SOLN: 500.0000 mg | INTRAVENOUS | Status: DC

## 2021-10-16 MED ORDER — LACTATED RINGERS IV BOLUS (SEPSIS)
1000.0000 mL | Freq: Once | INTRAVENOUS | Status: AC
  Administered 2021-10-16: 1000 mL via INTRAVENOUS

## 2021-10-16 MED ORDER — ACETAMINOPHEN 650 MG RE SUPP: 650.0000 mg | Freq: Four times a day (QID) | RECTAL | Status: DC | PRN

## 2021-10-16 MED ORDER — MORPHINE SULFATE (PF) 2 MG/ML IV SOLN: 2.0000 mg | INTRAVENOUS | Status: DC | PRN

## 2021-10-16 MED ORDER — HYDROCODONE BIT-HOMATROP MBR 5-1.5 MG/5ML PO SOLN
5.0000 mL | Freq: Four times a day (QID) | ORAL | Status: DC | PRN
  Administered 2021-10-17 – 2021-10-25 (×24): 5 mL via ORAL
  Filled 2021-10-16 (×24): qty 5

## 2021-10-16 MED ORDER — ACETAMINOPHEN 500 MG PO TABS
1000.0000 mg | ORAL_TABLET | Freq: Once | ORAL | Status: AC
  Administered 2021-10-16: 1000 mg via ORAL
  Filled 2021-10-16: qty 2

## 2021-10-16 MED ORDER — ONDANSETRON HCL 4 MG PO TABS
4.0000 mg | ORAL_TABLET | Freq: Four times a day (QID) | ORAL | Status: DC | PRN
  Administered 2021-10-18: 15:00:00 4 mg via ORAL
  Filled 2021-10-16: qty 1

## 2021-10-16 MED ORDER — SODIUM CHLORIDE 0.9 % IV SOLN
2.0000 g | Freq: Three times a day (TID) | INTRAVENOUS | Status: DC
  Administered 2021-10-17 – 2021-10-22 (×16): 2 g via INTRAVENOUS
  Filled 2021-10-16 (×16): qty 2

## 2021-10-16 MED ORDER — SODIUM CHLORIDE 0.9 % IV SOLN: 2.0000 g | INTRAVENOUS | Status: DC

## 2021-10-16 MED ORDER — SODIUM CHLORIDE 0.9 % IV SOLN: 2.0000 g | Freq: Three times a day (TID) | INTRAVENOUS | Status: DC

## 2021-10-16 MED ORDER — SODIUM CHLORIDE 0.9 % IV SOLN
2.0000 g | Freq: Once | INTRAVENOUS | Status: AC
  Administered 2021-10-16: 2 g via INTRAVENOUS
  Filled 2021-10-16: qty 2

## 2021-10-16 MED ORDER — SODIUM CHLORIDE 0.9 % IV BOLUS
1000.0000 mL | Freq: Once | INTRAVENOUS | Status: AC
Start: 2021-10-16 — End: 2021-10-16
  Administered 2021-10-16: 1000 mL via INTRAVENOUS

## 2021-10-16 NOTE — Progress Notes (Signed)
Antibiotics changed by provider

## 2021-10-16 NOTE — Progress Notes (Signed)
Notified bedside nurse of need to administer antibiotics.  

## 2021-10-16 NOTE — Progress Notes (Signed)
Notified provider of need to order antibiotics.   

## 2021-10-16 NOTE — ED Notes (Signed)
Dr. Deretha Emory notified that pt meets suspected sepsis criteria.

## 2021-10-16 NOTE — Plan of Care (Signed)
Paged per CXR findings concerning for TB -Obtain afb smear and sputum Cx x3 q8h -Obtain sputum Cx -IGRA -Sputum Cx, Respiratory viral panel -CT chest

## 2021-10-16 NOTE — ED Triage Notes (Signed)
C/o chest pain with foul smelling emesis for over a week

## 2021-10-16 NOTE — Progress Notes (Signed)
Pharmacy Antibiotic Note  Andrew Mccarty is a 41 y.o. male admitted on 10/16/2021 with pneumonia and sepsis.  Pharmacy has been consulted for vancomyicn and cefepime dosing.  Plan: Vancomycin 750 mg IV every 8 hours.  Goal trough 15-20 mcg/mL. Cefepime 2 gms IV q 8 hr.  Weight: 61.6 kg (135 lb 11.2 oz)  Temp (24hrs), Avg:100.3 F (37.9 C), Min:98.6 F (37 C), Max:102 F (38.9 C)  Recent Labs  Lab 10/16/21 1637 10/16/21 1644 10/16/21 1833  WBC 18.1*  --   --   CREATININE 0.74  --   --   LATICACIDVEN  --  1.5 1.9    Estimated Creatinine Clearance: 105.9 mL/min (by C-G formula based on SCr of 0.74 mg/dL).    Allergies  Allergen Reactions   Lactose Intolerance (Gi) Diarrhea   Morphine And Related     Antimicrobials this admission: Vancomycin  1/23 >> Cefepime  1/23 >>  Microbiology results: 1/23 Bcx: pending 1/23 MRSA PCR: pending  Thank you for allowing pharmacy to be a part of this patients care.  Blenda Nicely 10/16/2021 7:43 PM

## 2021-10-16 NOTE — ED Provider Notes (Signed)
[facility] EMERGENCY DEPARTMENT Provider Note   CSN: [id number] Arrival date & time: 10/16/21  1533     History  Chief Complaint  Patient presents with   Chest Pain    Andrew Mccarty is a 41 y.o. male who presents emergency department with a chief complaint of cough.  Patient had onset of a bad upper respiratory infection starting 3 weeks ago.  He was seen 2 weeks ago and diagnosed with costochondritis.  Patient states that 1 week ago his coughing became much worse.  He has had cough productive of pink-tinged sputum and blood.  He has been having soaking night sweats and has had a 10 pound weight loss in the last week.  He denies a history of recent foreign travel.  He has a distant history of incarceration but states that he has not been in any kind of trouble since his early 1620s.  He was not born outside of the [place].  He is a daily smoker.   Chest Pain     Home Medications Prior to Admission medications   Medication Sig Start Date End Date Taking? Authorizing Provider  naproxen sodium (ALEVE) 220 MG tablet Take 220 mg by mouth.   Yes [provider]  ibuprofen (ADVIL,MOTRIN) 800 MG tablet Take 1 tablet (800 mg total) by mouth 3 (three) times daily. Patient not taking: Reported on 10/16/2021 11/11/13   Pollyann SavoySheldon, Charles B, MD      Allergies    Lactose intolerance (gi) and Morphine and related    Review of Systems   Review of Systems  Cardiovascular:  Positive for chest pain.   Physical Exam Updated Vital Signs BP 118/79    Pulse 95    Temp 98.6 F (37 C) (Oral)    Resp 18    Wt 61.6 kg    SpO2 98%    BMI 16.52 kg/m  Physical Exam  ED Results / Procedures / Treatments   Labs (all labs ordered are listed, but only abnormal results are displayed) Labs Reviewed  CBC WITH DIFFERENTIAL/PLATELET - Abnormal; Notable for the following components:      Result Value   WBC 18.1 (*)    RBC 4.10 (*)    Hemoglobin 12.0 (*)    HCT 33.7 (*)    Platelets 604 (*)     Neutro Abs 14.8 (*)    Monocytes Absolute 1.7 (*)    Abs Immature Granulocytes 0.35 (*)    All other components within normal limits  COMPREHENSIVE METABOLIC PANEL - Abnormal; Notable for the following components:   Sodium 122 (*)    Chloride 87 (*)    Glucose, Bld 135 (*)    Calcium 8.1 (*)    Albumin 2.4 (*)    AST 95 (*)    ALT 77 (*)    All other components within normal limits  CULTURE, BLOOD (ROUTINE X 2)  CULTURE, BLOOD (ROUTINE X 2)  RESP PANEL BY RT-PCR (FLU A&B, COVID) ARPGX2  ACID FAST SMEAR (AFB, MYCOBACTERIA)  ACID FAST CULTURE WITH REFLEXED SENSITIVITIES (MYCOBACTERIA)  LACTIC ACID, PLASMA  LACTIC ACID, PLASMA  PROTIME-INR  APTT  URINALYSIS, ROUTINE W REFLEX MICROSCOPIC  BASIC METABOLIC PANEL  QUANTIFERON-TB GOLD PLUS  TROPONIN I (HIGH SENSITIVITY)  TROPONIN I (HIGH SENSITIVITY)    EKG EKG Interpretation  Date/Time:  Monday October 16 2021 18:38:51 EST Ventricular Rate:  87 PR Interval:  163 QRS Duration: 85 QT Interval:  398 QTC Calculation: 479 R Axis:   87  Text Interpretation: Sinus rhythm Consider left ventricular hypertrophy Abnrm T, consider ischemia, anterolateral lds ST elev, probable normal early repol pattern Confirmed by Vanetta Mulders [phone]) on 10/16/2021 7:53:58 PM  Radiology DG Chest 2 View  Result Date: 10/16/2021 CLINICAL DATA:  Chest pain productive cough EXAM: CHEST - 2 VIEW COMPARISON:  09/30/2021 FINDINGS: Consolidation and irregular lucencies now visible in the right upper lobe. No pleural effusion. Possible right paratracheal adenopathy. Normal cardiac size. No pneumothorax IMPRESSION: Interval consolidation and irregular lucencies in the right upper lobe, concerning for pulmonary infection/pneumonia, appearance raises concern for TB. There is possible right paratracheal adenopathy. Critical Value/emergent results were called by telephone at the time of interpretation on 10/16/2021 at 5:07 pm to provider PA Cammy Copa , who verbally  acknowledged these results. Electronically Signed   By: Jasmine Pang M.D.   On: 10/16/2021 17:08    Procedures Procedures    Medications Ordered in ED Medications  lactated ringers infusion (0 mLs Intravenous Stopped 10/16/21 1840)  vancomycin (VANCOREADY) IVPB 750 mg/150 mL (has no administration in time range)  ceFEPIme (MAXIPIME) 2 g in sodium chloride 0.9 % 100 mL IVPB (has no administration in time range)  HYDROcodone bit-homatropine (HYCODAN) 5-1.5 MG/5ML syrup 5 mL (has no administration in time range)  acetaminophen (TYLENOL) tablet 1,000 mg (1,000 mg Oral Given 10/16/21 1641)  sodium chloride 0.9 % bolus 1,000 mL (0 mLs Intravenous Stopped 10/16/21 1756)  lactated ringers bolus 1,000 mL (0 mLs Intravenous Stopped 10/16/21 1836)    And  lactated ringers bolus 1,000 mL (0 mLs Intravenous Stopped 10/16/21 1836)  vancomycin (VANCOCIN) IVPB 1000 mg/200 mL premix (0 mg Intravenous Stopped 10/16/21 1931)  ceFEPIme (MAXIPIME) 2 g in sodium chloride 0.9 % 100 mL IVPB (0 g Intravenous Stopped 10/16/21 1913)    ED Course/ Medical Decision Making/ A&P                           Medical Decision Making Amount and/or Complexity of Data Reviewed Labs: ordered. Radiology: ordered. ECG/medicine tests: ordered.  Risk OTC drugs. Prescription drug management. Decision regarding hospitalization.   This patient presents to the ED for concern of sob, this involves an extensive number of treatment options, and is a complaint that carries with it a high risk of complications and morbidity.  The differential diagnosis includes The emergent differential diagnosis for shortness of breath includes, but is not limited to, Pulmonary edema, bronchoconstriction, Pneumonia, Pulmonary embolism, Pneumotherax/ Hemothorax, Dysrythmia, ACS.  Patient with new upper lobe infiltrate.  Case discussed with Dr. Jake Samples of radiology with concerned that TB should be ruled out.  Differential also includes lung mass,  necrosis, bacterial pneumonia, Legionella.  Patient meets SIRS criteria and code sepsis initiated   Co morbidities that complicate the patient evaluation  Smoking, distant history of   Additional history obtained:  Additional history obtained from patient's friend   Lab Tests: CBC, CMP, lactic acid, troponin, respiratory panel, PT/INR, APTT  I Ordered, and personally interpreted labs.  The pertinent results include: Leukocytosis of 18.1 thousand, anemia of 12.0, sodium level of 122 in the setting of mildly elevated blood glucose, mildly elevated AST and ALT of insignificant value, albumin level low   Imaging Studies ordered:  I ordered imaging studies including two-view chest x-ray I independently visualized and interpreted imaging which showed right upper lobe infiltrate I agree with the radiologist interpretation   Cardiac Monitoring:  The patient was maintained on a cardiac monitor.  I  personally viewed and interpreted the cardiac monitored which showed an underlying rhythm of: Sinus tachycardia improved with fluids   Medicines ordered and prescription drug management:  I ordered medication including Vanco and cefepime for pneumonia, sepsis coverage Reevaluation of the patient after these medicines showed that the patient improved I have reviewed the patients home medicines and have made adjustments as needed   Test Considered:  CT of the chest, after acid-fast bacillus smear   Critical Interventions:  Fluids, antipyretics and broad-spectrum antibiotics   Consultations Obtained:  I requested consultation with the Dr. Carren Rang of [facility],  and discussed lab and imaging findings as well as pertinent plan - they recommend: Admission   Problem List / ED Course:  Community acquired pneumonia   Reevaluation:  After the interventions noted above, I reevaluated the patient and found that they have :improved   Social Determinants of  Health:  No outpatient follow-up   Dispostion:  After consideration of the diagnostic results and the patients response to treatment, I feel that the patent would benefit from admission.  Final Clinical Impression(s) / ED Diagnoses Final diagnoses:  Community acquired pneumonia of right upper lobe of lung  Cough productive of purulent sputum  Hemoptysis  Night sweats  Unintentional weight loss of more than 10 pounds  Hyponatremia    Rx / DC Orders ED Discharge Orders     None         Arthor Captain, PA-C 10/16/21 2051    Vanetta Mulders, MD 10/22/21 1606

## 2021-10-16 NOTE — Progress Notes (Signed)
Elink following code sepsis °

## 2021-10-17 ENCOUNTER — Other Ambulatory Visit: Payer: Self-pay

## 2021-10-17 DIAGNOSIS — J181 Lobar pneumonia, unspecified organism: Secondary | ICD-10-CM

## 2021-10-17 DIAGNOSIS — E440 Moderate protein-calorie malnutrition: Secondary | ICD-10-CM | POA: Insufficient documentation

## 2021-10-17 DIAGNOSIS — R6510 Systemic inflammatory response syndrome (SIRS) of non-infectious origin without acute organ dysfunction: Secondary | ICD-10-CM

## 2021-10-17 DIAGNOSIS — F17200 Nicotine dependence, unspecified, uncomplicated: Secondary | ICD-10-CM | POA: Diagnosis present

## 2021-10-17 DIAGNOSIS — R636 Underweight: Secondary | ICD-10-CM

## 2021-10-17 DIAGNOSIS — E871 Hypo-osmolality and hyponatremia: Secondary | ICD-10-CM | POA: Diagnosis present

## 2021-10-17 LAB — COMPREHENSIVE METABOLIC PANEL
ALT: 52 U/L — ABNORMAL HIGH (ref 0–44)
AST: 44 U/L — ABNORMAL HIGH (ref 15–41)
Albumin: 2.1 g/dL — ABNORMAL LOW (ref 3.5–5.0)
Alkaline Phosphatase: 73 U/L (ref 38–126)
Anion gap: 10 (ref 5–15)
BUN: 8 mg/dL (ref 6–20)
CO2: 25 mmol/L (ref 22–32)
Calcium: 8 mg/dL — ABNORMAL LOW (ref 8.9–10.3)
Chloride: 94 mmol/L — ABNORMAL LOW (ref 98–111)
Creatinine, Ser: 0.82 mg/dL (ref 0.61–1.24)
GFR, Estimated: >60 mL/min (ref >60)
Glucose, Bld: 137 mg/dL — ABNORMAL HIGH (ref 70–99)
Potassium: 3.6 mmol/L (ref 3.5–5.1)
Sodium: 129 mmol/L — ABNORMAL LOW (ref 135–145)
Total Bilirubin: 0.5 mg/dL (ref 0.3–1.2)
Total Protein: 6.2 g/dL — ABNORMAL LOW (ref 6.5–8.1)

## 2021-10-17 LAB — CBC WITH DIFFERENTIAL/PLATELET
Abs Immature Granulocytes: 0.39 10*3/uL — ABNORMAL HIGH (ref 0.00–0.07)
Basophils Absolute: 0.1 10*3/uL (ref 0.0–0.1)
Basophils Relative: 1 %
Eosinophils Absolute: 0.1 10*3/uL (ref 0.0–0.5)
Eosinophils Relative: 0 %
HCT: 31.6 % — ABNORMAL LOW (ref 39.0–52.0)
Hemoglobin: 10.9 g/dL — ABNORMAL LOW (ref 13.0–17.0)
Immature Granulocytes: 2 %
Lymphocytes Relative: 8 %
Lymphs Abs: 1.4 10*3/uL (ref 0.7–4.0)
MCH: 29.5 pg (ref 26.0–34.0)
MCHC: 34.5 g/dL (ref 30.0–36.0)
MCV: 85.6 fL (ref 80.0–100.0)
Monocytes Absolute: 1.3 10*3/uL — ABNORMAL HIGH (ref 0.1–1.0)
Monocytes Relative: 8 %
Neutro Abs: 13.7 10*3/uL — ABNORMAL HIGH (ref 1.7–7.7)
Neutrophils Relative %: 81 %
Platelets: 556 10*3/uL — ABNORMAL HIGH (ref 150–400)
RBC: 3.69 MIL/uL — ABNORMAL LOW (ref 4.22–5.81)
RDW: 13.2 % (ref 11.5–15.5)
WBC: 17 10*3/uL — ABNORMAL HIGH (ref 4.0–10.5)
nRBC: 0 % (ref 0.0–0.2)

## 2021-10-17 LAB — MRSA NEXT GEN BY PCR, NASAL: MRSA by PCR Next Gen: NOT DETECTED

## 2021-10-17 LAB — MAGNESIUM: Magnesium: 2 mg/dL (ref 1.7–2.4)

## 2021-10-17 LAB — EXPECTORATED SPUTUM ASSESSMENT W GRAM STAIN, RFLX TO RESP C

## 2021-10-17 LAB — HIV ANTIBODY (ROUTINE TESTING W REFLEX): HIV Screen 4th Generation wRfx: NONREACTIVE

## 2021-10-17 MED ORDER — DIPHENHYDRAMINE HCL 50 MG/ML IJ SOLN
25.0000 mg | Freq: Three times a day (TID) | INTRAMUSCULAR | Status: DC | PRN
  Administered 2021-10-17 – 2021-10-19 (×4): 25 mg via INTRAVENOUS
  Filled 2021-10-17 (×4): qty 1

## 2021-10-17 MED ORDER — ENSURE ENLIVE PO LIQD
237.0000 mL | Freq: Two times a day (BID) | ORAL | Status: DC
  Administered 2021-10-17 – 2021-10-25 (×14): 237 mL via ORAL

## 2021-10-17 MED ORDER — NICOTINE 14 MG/24HR TD PT24
14.0000 mg | MEDICATED_PATCH | Freq: Every day | TRANSDERMAL | Status: DC
  Administered 2021-10-17 – 2021-10-22 (×5): 14 mg via TRANSDERMAL
  Filled 2021-10-17 (×9): qty 1

## 2021-10-17 MED ORDER — SODIUM CHLORIDE 0.9 % IV SOLN: INTRAVENOUS | Status: DC

## 2021-10-17 NOTE — Progress Notes (Signed)
Gave sputum specimen cup to patient and instructed patient on how to use and when to use.

## 2021-10-17 NOTE — Discharge Instructions (Signed)
Nutrition Post Hospital Stay Proper nutrition can help your body recover from illness and injury.   Foods and beverages high in protein, vitamins, and minerals help rebuild muscle loss, promote healing, & reduce fall risk.   In addition to eating healthy foods, a nutrition shake is an easy, delicious way to get the nutrition you need during and after your hospital stay  It is recommended that you continue to drink 2 bottles per day of: High protein/high calorie nutrition shake  for at least 1 month (30 days) after your hospital stay   Tips for adding a nutrition shake into your routine: As allowed, drink one with vitamins or medications instead of water or juice Enjoy one as a tasty mid-morning or afternoon snack Drink cold or make a milkshake out of it Drink one instead of milk with cereal or snacks Use as a coffee creamer   Available at the following grocery stores and pharmacies:           * Karin Golden * [employer] * [employer]  * [employer]          * [employer] * [employer]  * [facility]      * Target  * [employer]   * [employer]  * [employer]   * [facility] [phone]            For COUPONS visit: www.ensure.com/join or RoleLink.com.br   Suggested Substitutions Ensure Plus = Boost Plus = Carnation Breakfast Essentials = Boost Compact Ensure Active Clear = Boost Breeze Glucerna Shake = Boost Glucose Control = Carnation Breakfast Essentials SUGAR FREE

## 2021-10-17 NOTE — H&P (Signed)
TRH H&P    Patient Demographics:    Andrew Mccarty, is a 41 y.o. male  MRN: [id number]  DOB - October 05, 1980  Admit Date - 10/16/2021  Referring MD/NP/PA: Rogene Houston  Outpatient Primary MD for the patient is Patient, No Pcp Per (Inactive)  Patient coming from: Home  Chief complaint- cough   HPI:    Andrew Mccarty  is a 41 y.o. male, with history of collapsed lung as a teenager and tobacco use disorder presents the ED with a chief complaint of dyspnea and cough.  Patient reports that he started with a common cold about a month ago.  He reports approximately 2 weeks into the course he started having chest pain and presented into the ED.  He was diagnosed with costochondritis which improved with naproxen.  The cough continued.  Over the last week he has had hemoptysis, copious amounts of mucus, he has felt feverish, and has had no appetite.  Patient reports that he is lost 10 pounds in the last week.  He thinks he is lost 20 pounds over the total month.  He was a thin guy to begin with at 6'4" 155 pounds, now down to 135 pounds per his report.  Patient does have a history of pneumothorax but reports that he has never been diagnosed with alpha antitrypsin 1 deficiency or other underlying causes of pneumothorax.  Patient reports that over the last week his fatigue is increased.  He has had loose stools couple times per day.  He cannot remember when his last normal meal was.  He has night sweats that wake him from sleep.  His cough is continuous.  So he decided to come back to the ED.  Patient reports no other complaints at this time.  Patient is a current smoker half a pack per day.  He drinks 3 beers per week.  He uses marijuana but has not used in the last month.  He is not vaccinated for COVID.  Patient is full code.  In the ED T-max 102, respiratory rate up to 32, blood pressure down to 105/74, maintaining oxygen  saturations Patient is leukocytosis 18.1 and thrombocytosis 604 Is hyponatremia 122 Troponins flat 7, 7 Lactic acid 1.5 and then 1.9 Negative COVID and flu EKG shows sinus tachycardia with a rate of 127 and QTC 424 Chest x-ray shows right upper lobe opacity is concerning for TB and possible peritracheal adenopathy Patient was started on vancomycin and cefepime Admission requested for further work-up and management of hemoptysis, right upper lobe pneumonia, weight loss    Review of systems:    In addition to the HPI above,  Admits to fever and chills No Headache, No changes with Vision or hearing, No problems swallowing food or Liquids, No Chest pain, admits to cough and dyspnea No Abdominal pain, admits to nausea but no vomiting, reports loose stools No Blood in stool or Urine, No dysuria, No new skin rashes or bruises, Reports myalgias No new asymmetric weakness, tingling, numbness in any extremity, Reports weight loss No polyuria, polydypsia or polyphagia, No  significant Mental Stressors.  All other systems reviewed and are negative.    Past History of the following :    Past Medical History:  Diagnosis Date   Collapsed lung       Past Surgical History:  Procedure Laterality Date   CHEST TUBE INSERTION        Social History:      Social History   Tobacco Use   Smoking status: Every Day    Packs/day: 0.50    Types: Cigarettes   Smokeless tobacco: Never  Substance Use Topics   Alcohol use: Yes    Comment: beer every other day       Family History :    History reviewed. No pertinent family history. Family history hypertension   Home Medications:   Prior to Admission medications   Medication Sig Start Date End Date Taking? Authorizing Provider  naproxen sodium (ALEVE) 220 MG tablet Take 220 mg by mouth.   Yes [provider]  ibuprofen (ADVIL,MOTRIN) 800 MG tablet Take 1 tablet (800 mg total) by mouth 3 (three) times daily. Patient not  taking: Reported on 10/16/2021 11/11/13   Truddie Hidden, MD     Allergies:     Allergies  Allergen Reactions   Lactose Intolerance (Gi) Diarrhea   Morphine And Related      Physical Exam:   Vitals  Blood pressure 134/83, pulse 88, temperature 99.8 F (37.7 C), temperature source Oral, resp. rate 18, height 6\' 4"  (1.93 m), weight 63.7 kg, SpO2 100 %.   1.  General: Patient lying supine in bed,  no acute distress   2. Psychiatric: Alert and oriented x 3, mood and behavior normal for situation, pleasant and cooperative with exam   3. Neurologic: Speech and language are normal, face is symmetric, moves all 4 extremities voluntarily, at baseline without acute deficits on limited exam   4. HEENMT:  Head is atraumatic, normocephalic, pupils reactive to light, no thyromegaly, no cervical adenopathy, trachea is midline, mucous membranes are moist   5. Respiratory : Lungs are clear to auscultation bilaterally without wheezing, rhonchi, rales, no cyanosis, no increase in work of breathing or accessory muscle use, brownish expectorated sputum at bedside   6. Cardiovascular : Heart rate tachycardic, rhythm is regular, no murmurs, rubs or gallops, no peripheral edema, peripheral pulses palpated   7. Gastrointestinal:  Abdomen is soft, nondistended, nontender to palpation bowel sounds active, no masses or organomegaly palpated   8. Skin:  Skin is warm, dry and intact without rashes, acute lesions, or ulcers on limited exam   9.Musculoskeletal:  No acute deformities or trauma, no asymmetry in tone, no peripheral edema, peripheral pulses palpated, no tenderness to palpation in the extremities     Data Review:    CBC Recent Labs  Lab 10/16/21 1637  WBC 18.1*  HGB 12.0*  HCT 33.7*  PLT 604*  MCV 82.2  MCH 29.3  MCHC 35.6  RDW 12.6  LYMPHSABS 1.2  MONOABS 1.7*  EOSABS 0.0  BASOSABS 0.1    ------------------------------------------------------------------------------------------------------------------  Results for orders placed or performed during the hospital encounter of 10/16/21 (from the past 48 hour(s))  CBC with Differential     Status: Abnormal   Collection Time: 10/16/21  4:37 PM  Result Value Ref Range   WBC 18.1 (H) 4.0 - 10.5 K/uL   RBC 4.10 (L) 4.22 - 5.81 MIL/uL   Hemoglobin 12.0 (L) 13.0 - 17.0 g/dL   HCT 33.7 (L) 39.0 - 52.0 %  MCV 82.2 80.0 - 100.0 fL   MCH 29.3 26.0 - 34.0 pg   MCHC 35.6 30.0 - 36.0 g/dL   RDW 12.6 11.5 - 15.5 %   Platelets 604 (H) 150 - 400 K/uL   nRBC 0.0 0.0 - 0.2 %   Neutrophils Relative % 81 %   Neutro Abs 14.8 (H) 1.7 - 7.7 K/uL   Lymphocytes Relative 7 %   Lymphs Abs 1.2 0.7 - 4.0 K/uL   Monocytes Relative 9 %   Monocytes Absolute 1.7 (H) 0.1 - 1.0 K/uL   Eosinophils Relative 0 %   Eosinophils Absolute 0.0 0.0 - 0.5 K/uL   Basophils Relative 1 %   Basophils Absolute 0.1 0.0 - 0.1 K/uL   Immature Granulocytes 2 %   Abs Immature Granulocytes 0.35 (H) 0.00 - 0.07 K/uL    Comment: Performed at [facility], [address], [place], [place] [address]  Comprehensive metabolic panel     Status: Abnormal   Collection Time: 10/16/21  4:37 PM  Result Value Ref Range   Sodium 122 (L) 135 - 145 mmol/L   Potassium 3.5 3.5 - 5.1 mmol/L   Chloride 87 (L) 98 - 111 mmol/L   CO2 24 22 - 32 mmol/L   Glucose, Bld 135 (H) 70 - 99 mg/dL    Comment: Glucose reference range applies only to samples taken after fasting for at least 8 hours.   BUN 10 6 - 20 mg/dL   Creatinine, Ser 0.74 0.61 - 1.24 mg/dL   Calcium 8.1 (L) 8.9 - 10.3 mg/dL   Total Protein 7.6 6.5 - 8.1 g/dL   Albumin 2.4 (L) 3.5 - 5.0 g/dL   AST 95 (H) 15 - 41 U/L   ALT 77 (H) 0 - 44 U/L   Alkaline Phosphatase 73 38 - 126 U/L   Total Bilirubin 0.4 0.3 - 1.2 mg/dL   GFR, Estimated >60 >60 mL/min    Comment: (NOTE) Calculated using the CKD-EPI Creatinine Equation (2021)     Anion gap 11 5 - 15    Comment: Performed at [facility], [address], [place], [place] [address]  Troponin I (High Sensitivity)     Status: None   Collection Time: 10/16/21  4:37 PM  Result Value Ref Range   Troponin I (High Sensitivity) 7 <18 ng/L    Comment: (NOTE) Elevated high sensitivity troponin I (hsTnI) values and significant  changes across serial measurements may suggest ACS but many other  chronic and acute conditions are known to elevate hsTnI results.  Refer to the "Links" section for chest pain algorithms and additional  guidance. Performed at [facility], [address], [place], [place] [address]   Blood culture (routine x 2)     Status: None (Preliminary result)   Collection Time: 10/16/21  4:44 PM   Specimen: Left Antecubital; Blood  Result Value Ref Range   Specimen Description LEFT ANTECUBITAL    Special Requests      BOTTLES DRAWN AEROBIC AND ANAEROBIC Blood Culture adequate volume Performed at [facility], [address], [place], [place] [address]    Culture PENDING    Report Status PENDING   Blood culture (routine x 2)     Status: None (Preliminary result)   Collection Time: 10/16/21  4:44 PM   Specimen: Left Antecubital; Blood  Result Value Ref Range   Specimen Description LEFT ANTECUBITAL    Special Requests      BOTTLES DRAWN AEROBIC AND ANAEROBIC Blood Culture adequate  volume Performed at [facility], [address], [place], [place] [address]    Culture PENDING    Report Status PENDING   Lactic acid, plasma     Status: None   Collection Time: 10/16/21  4:44 PM  Result Value Ref Range   Lactic Acid, Venous 1.5 0.5 - 1.9 mmol/L    Comment: Performed at [facility], [address], [place], [place] [address]  Protime-INR     Status: None   Collection Time: 10/16/21  4:51 PM  Result Value Ref Range   Prothrombin Time 13.9 11.4 - 15.2 seconds   INR 1.1 0.8 - 1.2    Comment: (NOTE) INR goal varies based on device and disease  states. Performed at [facility], [address] [place], [place] [address]   APTT     Status: None   Collection Time: 10/16/21  5:03 PM  Result Value Ref Range   aPTT 33 24 - 36 seconds    Comment: Performed at [facility], [address], [place], [place] [address]  Resp Panel by RT-PCR (Flu A&B, Covid) Nasopharyngeal Swab     Status: None   Collection Time: 10/16/21  5:50 PM   Specimen: Nasopharyngeal Swab; Nasopharyngeal(NP) swabs in vial transport medium  Result Value Ref Range   SARS Coronavirus 2 by RT PCR NEGATIVE NEGATIVE    Comment: (NOTE) SARS-CoV-2 target nucleic acids are NOT DETECTED.  The SARS-CoV-2 RNA is generally detectable in upper respiratory specimens during the acute phase of infection. The lowest concentration of SARS-CoV-2 viral copies this assay can detect is 138 copies/mL. A negative result does not preclude SARS-Cov-2 infection and should not be used as the sole basis for treatment or other patient management decisions. A negative result may occur with  improper specimen collection/handling, submission of specimen other than nasopharyngeal swab, presence of viral mutation(s) within the areas targeted by this assay, and inadequate number of viral copies(<138 copies/mL). A negative result must be combined with clinical observations, patient history, and epidemiological information. The expected result is Negative.  Fact Sheet for Patients:  EntrepreneurPulse.com.au  Fact Sheet for Healthcare Providers:  IncredibleEmployment.be  This test is no t yet approved or cleared by the [place] FDA and  has been authorized for detection and/or diagnosis of SARS-CoV-2 by FDA under an Emergency Use Authorization (EUA). This EUA will remain  in effect (meaning this test can be used) for the duration of the COVID-19 declaration under Section 564(b)(1) of the Act, 21 U.S.C.section 360bbb-3(b)(1), unless the authorization is  terminated  or revoked sooner.       Influenza A by PCR NEGATIVE NEGATIVE   Influenza B by PCR NEGATIVE NEGATIVE    Comment: (NOTE) The Xpert Xpress SARS-CoV-2/FLU/RSV plus assay is intended as an aid in the diagnosis of influenza from Nasopharyngeal swab specimens and should not be used as a sole basis for treatment. Nasal washings and aspirates are unacceptable for Xpert Xpress SARS-CoV-2/FLU/RSV testing.  Fact Sheet for Patients: EntrepreneurPulse.com.au  Fact Sheet for Healthcare Providers: IncredibleEmployment.be  This test is not yet approved or cleared by the [place] FDA and has been authorized for detection and/or diagnosis of SARS-CoV-2 by FDA under an Emergency Use Authorization (EUA). This EUA will remain in effect (meaning this test can be used) for the duration of the COVID-19 declaration under Section 564(b)(1) of the Act, 21 U.S.C. section 360bbb-3(b)(1), unless the authorization is terminated or revoked.  Performed at [facility], [address] [place], [place] [address]  Lactic acid, plasma     Status: None   Collection Time: 10/16/21  6:33 PM  Result Value Ref Range   Lactic Acid, Venous 1.9 0.5 - 1.9 mmol/L    Comment: Performed at [facility], [address] [place], [place] [address]  Troponin I (High Sensitivity)     Status: None   Collection Time: 10/16/21  6:33 PM  Result Value Ref Range   Troponin I (High Sensitivity) 7 <18 ng/L    Comment: (NOTE) Elevated high sensitivity troponin I (hsTnI) values and significant  changes across serial measurements may suggest ACS but many other  chronic and acute conditions are known to elevate hsTnI results.  Refer to the "Links" section for chest pain algorithms and additional  guidance. Performed at [facility], [address] [place], [place] [address]     Chemistries  Recent Labs  Lab 10/16/21 1637  NA 122*  K 3.5  CL 87*  CO2 24  GLUCOSE 135*  BUN  10  CREATININE 0.74  CALCIUM 8.1*  AST 95*  ALT 77*  ALKPHOS 73  BILITOT 0.4   ------------------------------------------------------------------------------------------------------------------  ------------------------------------------------------------------------------------------------------------------ GFR: Estimated Creatinine Clearance: 109.5 mL/min (by C-G formula based on SCr of 0.74 mg/dL). Liver Function Tests: Recent Labs  Lab 10/16/21 1637  AST 95*  ALT 77*  ALKPHOS 73  BILITOT 0.4  PROT 7.6  ALBUMIN 2.4*   No results for input(s): LIPASE, AMYLASE in the last 168 hours. No results for input(s): AMMONIA in the last 168 hours. Coagulation Profile: Recent Labs  Lab 10/16/21 1651  INR 1.1   Cardiac Enzymes: No results for input(s): CKTOTAL, CKMB, CKMBINDEX, TROPONINI in the last 168 hours. BNP (last 3 results) No results for input(s): PROBNP in the last 8760 hours. HbA1C: No results for input(s): HGBA1C in the last 72 hours. CBG: No results for input(s): GLUCAP in the last 168 hours. Lipid Profile: No results for input(s): CHOL, HDL, LDLCALC, TRIG, CHOLHDL, LDLDIRECT in the last 72 hours. Thyroid Function Tests: No results for input(s): TSH, T4TOTAL, FREET4, T3FREE, THYROIDAB in the last 72 hours. Anemia Panel: No results for input(s): VITAMINB12, FOLATE, FERRITIN, TIBC, IRON, RETICCTPCT in the last 72 hours.  --------------------------------------------------------------------------------------------------------------- Urine analysis: No results found for: COLORURINE, APPEARANCEUR, LABSPEC, PHURINE, GLUCOSEU, HGBUR, BILIRUBINUR, KETONESUR, PROTEINUR, UROBILINOGEN, NITRITE, LEUKOCYTESUR    Imaging Results:    DG Chest 2 View  Result Date: 10/16/2021 CLINICAL DATA:  Chest pain productive cough EXAM: CHEST - 2 VIEW COMPARISON:  09/30/2021 FINDINGS: Consolidation and irregular lucencies now visible in the right upper lobe. No pleural effusion. Possible  right paratracheal adenopathy. Normal cardiac size. No pneumothorax IMPRESSION: Interval consolidation and irregular lucencies in the right upper lobe, concerning for pulmonary infection/pneumonia, appearance raises concern for TB. There is possible right paratracheal adenopathy. Critical Value/emergent results were called by telephone at the time of interpretation on 10/16/2021 at 5:07 pm to provider PA Vernie Shanks , who verbally acknowledged these results. Electronically Signed   By: Donavan Foil M.D.   On: 10/16/2021 17:08       Assessment & Plan:    Principal Problem:   SIRS (systemic inflammatory response syndrome) (HCC) Active Problems:   Hyponatremia   Lobar pneumonia (HCC)   Tobacco use disorder   Underweight   SIRS  Tachypnea, febrile, leukocytosis, thrombocytosis No evidence of endorgan damage Source of infection is right upper lobe pneumonia Continue broad-spectrum antibiotics Continue IV fluid hydration Trend leukocytosis and thrombocytosis Blood cultures pending Sputum cultures pending Continue to monitor Lobar  pneumonia Right upper lobe pneumonia Radiology read-concern for TB Remote risk factor-incarcerated 20 years ago No known exposure Acid-fast stain and culture x3, QuantiFERON gold as recommended by ID Continue broad-spectrum antibiotics Expectorated sputum pending Blood cultures pending Continue to monitor Tobacco use disorder Counseled on the importance of cessation Nicotine patch ordered Hyponatremia Sodium down to 122 from 137/14 days Likely secondary to poor p.o. intake Continue gentle IV fluids Trend in the a.m. Continue to monitor Underweight Secondary to decreased appetite during infection interval Encourage p.o. intake And Ensure shakes if patient is not keeping up with p.o. intake adequately BMI 17    DVT Prophylaxis-   Heparin - SCDs   AM Labs Ordered, also please review Full Orders  Family Communication: No family at bedside Code  Status: Full  Admission status: Inpatient :The appropriate admission status for this patient is INPATIENT. Inpatient status is judged to be reasonable and necessary in order to provide the required intensity of service to ensure the patient's safety. The patient's presenting symptoms, physical exam findings, and initial radiographic and laboratory data in the context of their chronic comorbidities is felt to place them at high risk for further clinical deterioration. Furthermore, it is not anticipated that the patient will be medically stable for discharge from the hospital within 2 midnights of admission. The following factors support the admission status of inpatient.     The patient's presenting symptoms include cough hemoptysis. The worrisome physical exam findings include tachycardia, tachypnea, fever. The initial radiographic and laboratory data are worrisome because of possible TB on chest x-ray. The chronic co-morbidities include tobacco use disorder.       * I certify that at the point of admission it is my clinical judgment that the patient will require inpatient hospital care spanning beyond 2 midnights from the point of admission due to high intensity of service, high risk for further deterioration and high frequency of surveillance required.*  Disposition: Anticipated Discharge date 3 to 4 days discharge to home  Time spent in minutes : Arlee DO

## 2021-10-17 NOTE — Progress Notes (Signed)
Initial Nutrition Assessment  DOCUMENTATION CODES:   Non-severe (moderate) malnutrition in context of acute illness/injury  INTERVENTION:  Ensure Enlive po BID (chocolate)   Encourage meal /ONS intake  Multivitamin daily  NUTRITION DIAGNOSIS:   Moderate Malnutrition related to acute illness as evidenced by per patient/family report, energy intake < or equal to 75% for > or equal to 1 month, moderate muscle depletion, mild fat depletion, moderate fat depletion, mild muscle depletion, wt loss 9% x 1 month and BMI-17.09.   GOAL:  Patient will meet greater than or equal to 90% of their needs   MONITOR:  PO intake, Supplement acceptance, Labs, Weight trends  REASON FOR ASSESSMENT:   Malnutrition Screening Tool    ASSESSMENT: Patient is a 41 yo male with hx of  collapsed lung. Presents from home with cough. Recent diagnoses of costochondritis. Tobacco smoker.   Patient ate 75% of meal this morning.  Reports poor appetite the past 3-4 weeks. He has felt weak and has missed work due to feeling unwell. Emphasized the importance of nutrition as part of his health maintenance. Adequate energy and protein and encouraged continued ONS- BID daily until weight recovers to UBW range.  Patient reports weight loss. Review of records reflect- significant decrease of (6.6 kg) 9% x < 1 month.   Medications reviewed and include: Ensure , Nicoderm.  IV antibiotic-vancomycin  Labs:  BMP Latest Ref Rng & Units 10/16/2021 09/30/2021  Glucose 70 - 99 mg/dL 761(Y) 70(L)  BUN 6 - 20 mg/dL 10 <2(H)  Creatinine 5.74 - 1.24 mg/dL 7.34 0.37  Sodium 096 - 145 mmol/L 122(L) 137  Potassium 3.5 - 5.1 mmol/L 3.5 4.8  Chloride 98 - 111 mmol/L 87(L) 100  CO2 22 - 32 mmol/L 24 28  Calcium 8.9 - 10.3 mg/dL 8.1(L) 9.1     NUTRITION - FOCUSED PHYSICAL EXAM:  Flowsheet Row Most Recent Value  Orbital Region Moderate depletion  Upper Arm Region Moderate depletion  Thoracic and Lumbar Region Moderate depletion   Buccal Region Moderate depletion  Temple Region Mild depletion  Clavicle Bone Region Mild depletion  Clavicle and Acromion Bone Region Moderate depletion  Scapular Bone Region Moderate depletion  Dorsal Hand Mild depletion  Patellar Region Moderate depletion  Anterior Thigh Region Moderate depletion  Posterior Calf Region Mild depletion  Edema (RD Assessment) None  Hair Reviewed  Eyes Reviewed  Mouth Reviewed  Skin Reviewed  Nails Reviewed       Diet Order:   Diet Order             Diet Heart Room service appropriate? Yes; Fluid consistency: Thin  Diet effective now                   EDUCATION NEEDS:  Education needs have been addressed  Skin:  Skin Assessment: Reviewed RN Assessment  Last BM:  1/23  Height:   Ht Readings from Last 1 Encounters:  10/16/21 6\' 4"  (1.93 m)    Weight:   Wt Readings from Last 1 Encounters:  10/16/21 63.7 kg    Ideal Body Weight:   92 kg  BMI:  Body mass index is 17.09 kg/m.  Estimated Nutritional Needs:   Kcal:  2200-2300  Protein:  90-95 gr  Fluid:  >2 liters daily  10/18/21 MS,RD,CSG,LDN Contact: Royann Shivers

## 2021-10-17 NOTE — Progress Notes (Signed)
°  Transition of Care [facility]) Screening Note   Patient Details  Name: Andrew Mccarty Date of Birth: 1980/10/16   Transition of Care [facility]) CM/SW Contact:    Villa Herb, LCSWA Phone Number: 10/17/2021, 1:28 PM    Transition of Care Department [facility]) has reviewed patient and no TOC needs have been identified at this time. We will continue to monitor patient advancement through interdisciplinary progression rounds. If new patient transition needs arise, please place a TOC consult.

## 2021-10-17 NOTE — Care Plan (Signed)
This 41 years old male with PMH significant for collapsed lung as a teenager and tobacco use disorder presented in the ED with c/o: Chronic cough, shortness of breath, night sweats, weight loss, low-grade fever.  He reports his symptoms started 4 weeks ago, over the last week he has had hemoptysis,  copious amount of mucus associated with fever.  He reports he has lost around 20 pounds in 1 month.  He is chronic smoker, half pack a day.  He is not vaccinated for COVID. Work-up reveals SIRS(fever, tachycardia, tachypnea, hypotension, leukocytosis).  Chest x-ray showed right upper lobe opacity concerning for TB and possible peritracheal adenopathy. Patient was started on empiric antibiotics vancomycin and cefepime for possible Lobar pneumonia, infectious disease consulted , recommended obtaining AFB sputum cultures.  QuantiFERON gold test.  He is also found to have hyponatremia,  continued on gentle hydration.  Patient was seen and examined at bedside.  He denies any chest pain or shortness of breath.

## 2021-10-18 LAB — BASIC METABOLIC PANEL
Anion gap: 8 (ref 5–15)
BUN: 7 mg/dL (ref 6–20)
CO2: 26 mmol/L (ref 22–32)
Calcium: 8 mg/dL — ABNORMAL LOW (ref 8.9–10.3)
Chloride: 95 mmol/L — ABNORMAL LOW (ref 98–111)
Creatinine, Ser: 0.7 mg/dL (ref 0.61–1.24)
GFR, Estimated: >60 mL/min (ref >60)
Glucose, Bld: 94 mg/dL (ref 70–99)
Potassium: 3.7 mmol/L (ref 3.5–5.1)
Sodium: 129 mmol/L — ABNORMAL LOW (ref 135–145)

## 2021-10-18 LAB — CBC
HCT: 31.9 % — ABNORMAL LOW (ref 39.0–52.0)
Hemoglobin: 10.7 g/dL — ABNORMAL LOW (ref 13.0–17.0)
MCH: 28.5 pg (ref 26.0–34.0)
MCHC: 33.5 g/dL (ref 30.0–36.0)
MCV: 85.1 fL (ref 80.0–100.0)
Platelets: 661 10*3/uL — ABNORMAL HIGH (ref 150–400)
RBC: 3.75 MIL/uL — ABNORMAL LOW (ref 4.22–5.81)
RDW: 13.1 % (ref 11.5–15.5)
WBC: 14.8 10*3/uL — ABNORMAL HIGH (ref 4.0–10.5)
nRBC: 0 % (ref 0.0–0.2)

## 2021-10-18 LAB — ACID FAST SMEAR (AFB, MYCOBACTERIA): Acid Fast Smear: NEGATIVE

## 2021-10-18 LAB — MAGNESIUM: Magnesium: 1.8 mg/dL (ref 1.7–2.4)

## 2021-10-18 LAB — PHOSPHORUS: Phosphorus: 2.8 mg/dL (ref 2.5–4.6)

## 2021-10-18 NOTE — Progress Notes (Signed)
HPI:    Andrew Mccarty  is a 41 y.o. male, with history of collapsed lung as a teenager and tobacco use disorder presents the ED with a chief complaint of dyspnea and cough.  Patient reports that he started with a common cold about a month ago.  He reports approximately 2 weeks into the course he started having chest pain and presented into the ED.  He was diagnosed with costochondritis which improved with naproxen.  The cough continued.  Over the last week he has had hemoptysis, copious amounts of mucus, he has felt feverish, and has had no appetite.  Patient reports that he is lost 10 pounds in the last week.  He thinks he is lost 20 pounds over the total month.  He was a thin guy to begin with at 6'4" 155 pounds, now down to 135 pounds per his report.  Patient does have a history of pneumothorax but reports that he has never been diagnosed with alpha antitrypsin 1 deficiency or other underlying causes of pneumothorax.  Patient reports that over the last week his fatigue is increased.  He has had loose stools couple times per day.  He cannot remember when his last normal meal was.  He has night sweats that wake him from sleep.  His cough is continuous.  So he decided to come back to the ED.  Patient reports no other complaints at this time.  Patient is a current smoker half a pack per day.  He drinks 3 beers per week.  He uses marijuana but has not used in the last month.  He is not vaccinated for COVID.  Patient is full code.  In the ED T-max 102, respiratory rate up to 32, blood pressure down to 105/74, maintaining oxygen saturations Patient is leukocytosis 18.1 and thrombocytosis 604 Is hyponatremia 122 Troponins flat 7, 7 Lactic acid 1.5 and then 1.9 Negative COVID and flu EKG shows sinus tachycardia with a rate of 127 and QTC 424 Chest x-ray shows right upper lobe opacity is concerning for TB and possible  peritracheal adenopathy Patient was started on vancomycin and cefepime Admission requested for further work-up and management of hemoptysis, right upper lobe pneumonia, weight loss  Subjective No hemoptysis feeling some better no coughing a lot    Physical Exam:   Vitals  Blood pressure 136/79, pulse 99, temperature 99.6 F (37.6 C), temperature source Oral, resp. rate 18, height 6\' 4"  (1.93 m), weight 63.7 kg, SpO2 99 %.   1.  General: Patient lying supine in bed,  no acute distress   2. Psychiatric: Alert and oriented x 3, mood and behavior normal for situation, pleasant and cooperative with exam   3. Neurologic: Speech and language are normal, face is symmetric, moves all 4 extremities voluntarily, at baseline without acute deficits on limited exam   4. HEENMT:  Head is atraumatic, normocephalic, pupils reactive to light, no thyromegaly, no cervical adenopathy, trachea is midline, mucous membranes are moist   5. Respiratory : Lungs are clear to auscultation bilaterally without wheezing,  rhonchi, rales, no cyanosis, no increase in work of breathing or accessory muscle use, brownish expectorated sputum at bedside   6. Cardiovascular : Heart rate tachycardic, rhythm is regular, no murmurs, rubs or gallops, no peripheral edema, peripheral pulses palpated   7. Gastrointestinal:  Abdomen is soft, nondistended, nontender to palpation bowel sounds active, no masses or organomegaly palpated   8. Skin:  Skin is warm, dry and intact without rashes, acute lesions, or ulcers on limited exam   9.Musculoskeletal:  No acute deformities or trauma, no asymmetry in tone, no peripheral edema, peripheral pulses palpated, no tenderness to palpation in the extremities     Data Review:    CBC Recent Labs  Lab 10/16/21 1637 10/17/21 1035 10/18/21 0547  WBC 18.1* 17.0* 14.8*  HGB 12.0* 10.9* 10.7*  HCT 33.7* 31.6* 31.9*  PLT 604* 556* 661*  MCV 82.2 85.6 85.1  MCH 29.3 29.5 28.5   MCHC 35.6 34.5 33.5  RDW 12.6 13.2 13.1  LYMPHSABS 1.2 1.4  --   MONOABS 1.7* 1.3*  --   EOSABS 0.0 0.1  --   BASOSABS 0.1 0.1  --    ------------------------------------------------------------------------------------------------------------------  Results for orders placed or performed during the hospital encounter of 10/16/21 (from the past 48 hour(s))  CBC with Differential     Status: Abnormal   Collection Time: 10/16/21  4:37 PM  Result Value Ref Range   WBC 18.1 (H) 4.0 - 10.5 K/uL   RBC 4.10 (L) 4.22 - 5.81 MIL/uL   Hemoglobin 12.0 (L) 13.0 - 17.0 g/dL   HCT 33.7 (L) 39.0 - 52.0 %   MCV 82.2 80.0 - 100.0 fL   MCH 29.3 26.0 - 34.0 pg   MCHC 35.6 30.0 - 36.0 g/dL   RDW 12.6 11.5 - 15.5 %   Platelets 604 (H) 150 - 400 K/uL   nRBC 0.0 0.0 - 0.2 %   Neutrophils Relative % 81 %   Neutro Abs 14.8 (H) 1.7 - 7.7 K/uL   Lymphocytes Relative 7 %   Lymphs Abs 1.2 0.7 - 4.0 K/uL   Monocytes Relative 9 %   Monocytes Absolute 1.7 (H) 0.1 - 1.0 K/uL   Eosinophils Relative 0 %   Eosinophils Absolute 0.0 0.0 - 0.5 K/uL   Basophils Relative 1 %   Basophils Absolute 0.1 0.0 - 0.1 K/uL   Immature Granulocytes 2 %   Abs Immature Granulocytes 0.35 (H) 0.00 - 0.07 K/uL    Comment: Performed at [facility], [address], [place], [place] [address]  Comprehensive metabolic panel     Status: Abnormal   Collection Time: 10/16/21  4:37 PM  Result Value Ref Range   Sodium 122 (L) 135 - 145 mmol/L   Potassium 3.5 3.5 - 5.1 mmol/L   Chloride 87 (L) 98 - 111 mmol/L   CO2 24 22 - 32 mmol/L   Glucose, Bld 135 (H) 70 - 99 mg/dL    Comment: Glucose reference range applies only to samples taken after fasting for at least 8 hours.   BUN 10 6 - 20 mg/dL   Creatinine, Ser 0.74 0.61 - 1.24 mg/dL   Calcium 8.1 (L) 8.9 - 10.3 mg/dL   Total Protein 7.6 6.5 - 8.1 g/dL   Albumin 2.4 (L) 3.5 - 5.0 g/dL   AST 95 (H) 15 - 41 U/L   ALT 77 (H) 0 - 44 U/L   Alkaline Phosphatase 73 38 - 126 U/L   Total  Bilirubin 0.4 0.3 -  1.2 mg/dL   GFR, Estimated >82 >42 mL/min    Comment: (NOTE) Calculated using the CKD-EPI Creatinine Equation (2021)    Anion gap 11 5 - 15    Comment: Performed at [facility], [address], [place], [place] [address]  Troponin I (High Sensitivity)     Status: None   Collection Time: 10/16/21  4:37 PM  Result Value Ref Range   Troponin I (High Sensitivity) 7 <18 ng/L    Comment: (NOTE) Elevated high sensitivity troponin I (hsTnI) values and significant  changes across serial measurements may suggest ACS but many other  chronic and acute conditions are known to elevate hsTnI results.  Refer to the "Links" section for chest pain algorithms and additional  guidance. Performed at [facility], [address], [place], [place] [address]   Blood culture (routine x 2)     Status: None (Preliminary result)   Collection Time: 10/16/21  4:44 PM   Specimen: Left Antecubital; Blood  Result Value Ref Range   Specimen Description LEFT ANTECUBITAL    Special Requests      BOTTLES DRAWN AEROBIC AND ANAEROBIC Blood Culture adequate volume   Culture      NO GROWTH 2 DAYS Performed at [facility], [address], [place], [place] [address]    Report Status PENDING   Blood culture (routine x 2)     Status: None (Preliminary result)   Collection Time: 10/16/21  4:44 PM   Specimen: Left Antecubital; Blood  Result Value Ref Range   Specimen Description LEFT ANTECUBITAL    Special Requests      BOTTLES DRAWN AEROBIC AND ANAEROBIC Blood Culture adequate volume   Culture      NO GROWTH 2 DAYS Performed at [facility], [address], [place], [place] [address]    Report Status PENDING   Lactic acid, plasma     Status: None   Collection Time: 10/16/21  4:44 PM  Result Value Ref Range   Lactic Acid, Venous 1.5 0.5 - 1.9 mmol/L    Comment: Performed at [facility], [address], [place], [place] [address]  Protime-INR     Status: None   Collection Time: 10/16/21  4:51 PM   Result Value Ref Range   Prothrombin Time 13.9 11.4 - 15.2 seconds   INR 1.1 0.8 - 1.2    Comment: (NOTE) INR goal varies based on device and disease states. Performed at [facility], [address], [place], [place] [address]   APTT     Status: None   Collection Time: 10/16/21  5:03 PM  Result Value Ref Range   aPTT 33 24 - 36 seconds    Comment: Performed at [facility], [address], [place], [place] [address]  Resp Panel by RT-PCR (Flu A&B, Covid) Nasopharyngeal Swab     Status: None   Collection Time: 10/16/21  5:50 PM   Specimen: Nasopharyngeal Swab; Nasopharyngeal(NP) swabs in vial transport medium  Result Value Ref Range   SARS Coronavirus 2 by RT PCR NEGATIVE NEGATIVE    Comment: (NOTE) SARS-CoV-2 target nucleic acids are NOT DETECTED.  The SARS-CoV-2 RNA is generally detectable in upper respiratory specimens during the acute phase of infection. The lowest concentration of SARS-CoV-2 viral copies this assay can detect is 138 copies/mL. A negative result does not preclude SARS-Cov-2 infection and should not be used as the sole basis for treatment or other patient management decisions. A negative result may occur with  improper specimen collection/handling, submission  of specimen other than nasopharyngeal swab, presence of viral mutation(s) within the areas targeted by this assay, and inadequate number of viral copies(<138 copies/mL). A negative result must be combined with clinical observations, patient history, and epidemiological information. The expected result is Negative.  Fact Sheet for Patients:  EntrepreneurPulse.com.au  Fact Sheet for Healthcare Providers:  IncredibleEmployment.be  This test is no t yet approved or cleared by the [place] FDA and  has been authorized for detection and/or diagnosis of SARS-CoV-2 by FDA under an Emergency Use Authorization (EUA). This EUA will remain  in effect (meaning this test can  be used) for the duration of the COVID-19 declaration under Section 564(b)(1) of the Act, 21 U.S.C.section 360bbb-3(b)(1), unless the authorization is terminated  or revoked sooner.       Influenza A by PCR NEGATIVE NEGATIVE   Influenza B by PCR NEGATIVE NEGATIVE    Comment: (NOTE) The Xpert Xpress SARS-CoV-2/FLU/RSV plus assay is intended as an aid in the diagnosis of influenza from Nasopharyngeal swab specimens and should not be used as a sole basis for treatment. Nasal washings and aspirates are unacceptable for Xpert Xpress SARS-CoV-2/FLU/RSV testing.  Fact Sheet for Patients: EntrepreneurPulse.com.au  Fact Sheet for Healthcare Providers: IncredibleEmployment.be  This test is not yet approved or cleared by the [place] FDA and has been authorized for detection and/or diagnosis of SARS-CoV-2 by FDA under an Emergency Use Authorization (EUA). This EUA will remain in effect (meaning this test can be used) for the duration of the COVID-19 declaration under Section 564(b)(1) of the Act, 21 U.S.C. section 360bbb-3(b)(1), unless the authorization is terminated or revoked.  Performed at [facility], [address] [place], [place] [address]   Lactic acid, plasma     Status: None   Collection Time: 10/16/21  6:33 PM  Result Value Ref Range   Lactic Acid, Venous 1.9 0.5 - 1.9 mmol/L    Comment: Performed at [facility], [address], [place], [place] [address]  Troponin I (High Sensitivity)     Status: None   Collection Time: 10/16/21  6:33 PM  Result Value Ref Range   Troponin I (High Sensitivity) 7 <18 ng/L    Comment: (NOTE) Elevated high sensitivity troponin I (hsTnI) values and significant  changes across serial measurements may suggest ACS but many other  chronic and acute conditions are known to elevate hsTnI results.  Refer to the "Links" section for chest pain algorithms and additional  guidance. Performed at [facility], [address] [place], [place] [address]   Acid Fast Smear (AFB)     Status: None   Collection Time: 10/17/21  4:41 AM   Specimen: Expectorated Sputum; Respiratory  Result Value Ref Range   AFB Specimen Processing Concentration    Acid Fast Smear Negative     Comment: (NOTE) Performed At: [facility], [place] [id number] Rush Farmer MD [id number]    Source (AFB) SPU     Comment: Performed at [facility], [address], [place], [place] [address]  Expectorated Sputum Assessment w Gram Stain, Rflx to Resp Cult     Status: None   Collection Time: 10/17/21  4:41 AM   Specimen: Sputum  Result Value Ref Range   Specimen Description SPU    Special Requests NONE    Sputum evaluation      THIS SPECIMEN IS ACCEPTABLE FOR SPUTUM CULTURE Performed at [facility], [address], [place],  [address]    Report Status  10/17/2021 FINAL   Culture, Respiratory w Gram Stain     Status: None (Preliminary result)   Collection Time: 10/17/21  4:41 AM   Specimen: Sputum  Result Value Ref Range   Specimen Description      SPU Performed at [facility], [address] [place], [place] [address]    Special Requests      NONE Reflexed from [id number] Performed at [facility], [address], [place], [place] [address]    Gram Stain      FEW WBC PRESENT, PREDOMINANTLY MONONUCLEAR FEW GRAM POSITIVE COCCI IN PAIRS FEW GRAM VARIABLE ROD    Culture      CULTURE REINCUBATED FOR BETTER GROWTH Performed at [facility], [place] [address], [place], [place] [address]    Report Status PENDING   MRSA Next Gen by PCR, Nasal     Status: None   Collection Time: 10/17/21 10:28 AM   Specimen: Nasal Mucosa; Nasal Swab  Result Value Ref Range   MRSA by PCR Next Gen NOT DETECTED NOT DETECTED    Comment: (NOTE) The GeneXpert MRSA Assay (FDA approved for NASAL specimens only), is one component of a comprehensive MRSA colonization surveillance program. It is not  intended to diagnose MRSA infection nor to guide or monitor treatment for MRSA infections. Test performance is not FDA approved in patients less than 46 years old. Performed at [facility], [address] [place], [place] [address]   HIV Antibody (routine testing w rflx)     Status: None   Collection Time: 10/17/21 10:35 AM  Result Value Ref Range   HIV Screen 4th Generation wRfx Non Reactive Non Reactive    Comment: Performed at [facility], [place] [address] [place], [place] [address]  Magnesium     Status: None   Collection Time: 10/17/21 10:35 AM  Result Value Ref Range   Magnesium 2.0 1.7 - 2.4 mg/dL    Comment: Performed at [facility], [address] [place], [place] [address]  CBC WITH DIFFERENTIAL     Status: Abnormal   Collection Time: 10/17/21 10:35 AM  Result Value Ref Range   WBC 17.0 (H) 4.0 - 10.5 K/uL   RBC 3.69 (L) 4.22 - 5.81 MIL/uL   Hemoglobin 10.9 (L) 13.0 - 17.0 g/dL   HCT 31.6 (L) 39.0 - 52.0 %   MCV 85.6 80.0 - 100.0 fL   MCH 29.5 26.0 - 34.0 pg   MCHC 34.5 30.0 - 36.0 g/dL   RDW 13.2 11.5 - 15.5 %   Platelets 556 (H) 150 - 400 K/uL   nRBC 0.0 0.0 - 0.2 %   Neutrophils Relative % 81 %   Neutro Abs 13.7 (H) 1.7 - 7.7 K/uL   Lymphocytes Relative 8 %   Lymphs Abs 1.4 0.7 - 4.0 K/uL   Monocytes Relative 8 %   Monocytes Absolute 1.3 (H) 0.1 - 1.0 K/uL   Eosinophils Relative 0 %   Eosinophils Absolute 0.1 0.0 - 0.5 K/uL   Basophils Relative 1 %   Basophils Absolute 0.1 0.0 - 0.1 K/uL   Immature Granulocytes 2 %   Abs Immature Granulocytes 0.39 (H) 0.00 - 0.07 K/uL    Comment: Performed at [facility], [address] [place],  [address]  Comprehensive metabolic panel     Status: Abnormal   Collection Time: 10/17/21 10:35 AM  Result Value Ref Range   Sodium 129 (L) 135 - 145 mmol/L    Comment: DELTA CHECK  NOTED   Potassium 3.6 3.5 - 5.1 mmol/L   Chloride 94 (L) 98 - 111 mmol/L   CO2 25 22 - 32 mmol/L   Glucose, Bld 137 (H) 70 - 99 mg/dL     Comment: Glucose reference range applies only to samples taken after fasting for at least 8 hours.   BUN 8 6 - 20 mg/dL   Creatinine, Ser 0.82 0.61 - 1.24 mg/dL   Calcium 8.0 (L) 8.9 - 10.3 mg/dL   Total Protein 6.2 (L) 6.5 - 8.1 g/dL   Albumin 2.1 (L) 3.5 - 5.0 g/dL   AST 44 (H) 15 - 41 U/L   ALT 52 (H) 0 - 44 U/L   Alkaline Phosphatase 73 38 - 126 U/L   Total Bilirubin 0.5 0.3 - 1.2 mg/dL   GFR, Estimated >60 >60 mL/min    Comment: (NOTE) Calculated using the CKD-EPI Creatinine Equation (2021)    Anion gap 10 5 - 15    Comment: Performed at [facility], [address], [place], [place] [id number]  Basic metabolic panel     Status: Abnormal   Collection Time: 10/18/21  5:47 AM  Result Value Ref Range   Sodium 129 (L) 135 - 145 mmol/L   Potassium 3.7 3.5 - 5.1 mmol/L   Chloride 95 (L) 98 - 111 mmol/L   CO2 26 22 - 32 mmol/L   Glucose, Bld 94 70 - 99 mg/dL    Comment: Glucose reference range applies only to samples taken after fasting for at least 8 hours.   BUN 7 6 - 20 mg/dL   Creatinine, Ser 0.70 0.61 - 1.24 mg/dL   Calcium 8.0 (L) 8.9 - 10.3 mg/dL   GFR, Estimated >60 >60 mL/min    Comment: (NOTE) Calculated using the CKD-EPI Creatinine Equation (2021)    Anion gap 8 5 - 15    Comment: Performed at [facility], [address] [place], [place] [address]  CBC     Status: Abnormal   Collection Time: 10/18/21  5:47 AM  Result Value Ref Range   WBC 14.8 (H) 4.0 - 10.5 K/uL   RBC 3.75 (L) 4.22 - 5.81 MIL/uL   Hemoglobin 10.7 (L) 13.0 - 17.0 g/dL   HCT 31.9 (L) 39.0 - 52.0 %   MCV 85.1 80.0 - 100.0 fL   MCH 28.5 26.0 - 34.0 pg   MCHC 33.5 30.0 - 36.0 g/dL   RDW 13.1 11.5 - 15.5 %   Platelets 661 (H) 150 - 400 K/uL   nRBC 0.0 0.0 - 0.2 %    Comment: Performed at [facility], [address] [place], [place] [address]  Magnesium     Status: None   Collection Time: 10/18/21  5:47 AM  Result Value Ref Range   Magnesium 1.8 1.7 - 2.4 mg/dL    Comment: Performed at [facility], [address] [place], [place] [address]  Phosphorus     Status: None   Collection Time: 10/18/21  5:47 AM  Result Value Ref Range   Phosphorus 2.8 2.5 - 4.6 mg/dL    Comment: Performed at [facility], [address], [place], [place] [address]    Chemistries  Recent Labs  Lab 10/16/21 1637 10/17/21 1035 10/18/21 0547  NA 122* 129* 129*  K 3.5 3.6 3.7  CL 87* 94* 95*  CO2 24 25 26   GLUCOSE 135* 137* 94  BUN 10 8 7   CREATININE 0.74 0.82 0.70  CALCIUM 8.1* 8.0* 8.0*  MG  --  2.0 1.8  AST 95* 44*  --   ALT 77* 52*  --   ALKPHOS 73 73  --   BILITOT 0.4 0.5  --    ------------------------------------------------------------------------------------------------------------------  ------------------------------------------------------------------------------------------------------------------ GFR: Estimated Creatinine Clearance: 109.5 mL/min (by C-G formula based on SCr of 0.7 mg/dL). Liver Function Tests: Recent Labs  Lab 10/16/21 1637 10/17/21 1035  AST 95* 44*  ALT 77* 52*  ALKPHOS 73 73  BILITOT 0.4 0.5  PROT 7.6 6.2*  ALBUMIN 2.4* 2.1*   No results for input(s): LIPASE, AMYLASE in the last 168 hours. No results for input(s): AMMONIA in the last 168 hours. Coagulation Profile: Recent Labs  Lab 10/16/21 1651  INR 1.1   Cardiac Enzymes: No results for input(s): CKTOTAL, CKMB, CKMBINDEX, TROPONINI in the last 168 hours. BNP (last 3 results) No results for input(s): PROBNP in the last 8760 hours. HbA1C: No results for input(s): HGBA1C in the last 72 hours. CBG: No results for input(s): GLUCAP in the last 168 hours. Lipid Profile: No results for input(s): CHOL, HDL, LDLCALC, TRIG, CHOLHDL, LDLDIRECT in the last 72 hours. Thyroid Function Tests: No results for input(s): TSH, T4TOTAL, FREET4, T3FREE, THYROIDAB in the last 72 hours. Anemia Panel: No results for input(s): VITAMINB12, FOLATE, FERRITIN, TIBC, IRON, RETICCTPCT in the last 72  hours.  --------------------------------------------------------------------------------------------------------------- Urine analysis: No results found for: COLORURINE, APPEARANCEUR, LABSPEC, PHURINE, GLUCOSEU, HGBUR, BILIRUBINUR, KETONESUR, PROTEINUR, UROBILINOGEN, NITRITE, LEUKOCYTESUR    Imaging Results:    DG Chest 2 View  Result Date: 10/16/2021 CLINICAL DATA:  Chest pain productive cough EXAM: CHEST - 2 VIEW COMPARISON:  09/30/2021 FINDINGS: Consolidation and irregular lucencies now visible in the right upper lobe. No pleural effusion. Possible right paratracheal adenopathy. Normal cardiac size. No pneumothorax IMPRESSION: Interval consolidation and irregular lucencies in the right upper lobe, concerning for pulmonary infection/pneumonia, appearance raises concern for TB. There is possible right paratracheal adenopathy. Critical Value/emergent results were called by telephone at the time of interpretation on 10/16/2021 at 5:07 pm to provider PA Vernie Shanks , who verbally acknowledged these results. Electronically Signed   By: Donavan Foil M.D.   On: 10/16/2021 17:08       Assessment & Plan:    Principal Problem:   SIRS (systemic inflammatory response syndrome) (HCC) Active Problems:   Hyponatremia   Lobar pneumonia (HCC)   Tobacco use disorder   Underweight   Malnutrition of moderate degree   SIRS  Tachypnea, febrile, leukocytosis, thrombocytosis No evidence of endorgan damage Source of infection is right upper lobe pneumonia Continue broad-spectrum antibiotics Continue IV fluid hydration Trend leukocytosis and thrombocytosis Blood cultures pending Sputum cultures pending Continue to monitor Lobar pneumonia Right upper lobe pneumonia Radiology read-concern for TB Remote risk factor-incarcerated 20 years ago No known exposure Acid-fast stain and culture x3, QuantiFERON gold as recommended by ID Continue broad-spectrum antibiotics Expectorated sputum pending Blood  cultures pending Continue to monitor Tobacco use disorder Counseled on the importance of cessation Nicotine patch ordered Hyponatremia Sodium down to 122 from 137/14 days Likely secondary to poor p.o. intake Continue gentle IV fluids Trend in the a.m. Continue to monitor Underweight Secondary to decreased appetite during infection interval Encourage p.o. intake And Ensure shakes if patient is not keeping up with p.o. intake adequately BMI 17    DVT Prophylaxis-   Heparin - SCDs   AM Labs Ordered, also please review Full Orders  Family Communication: No family at bedside Code Status: Full  Admission status: Inpatient :The appropriate admission  status for this patient is INPATIENT. Inpatient status is judged to be reasonable and necessary in order to provide the required intensity of service to ensure the patient's safety. The patient's presenting symptoms, physical exam findings, and initial radiographic and laboratory data in the context of their chronic comorbidities is felt to place them at high risk for further clinical deterioration. Furthermore, it is not anticipated that the patient will be medically stable for discharge from the hospital within 2 midnights of admission. The following factors support the admission status of inpatient.     The patient's presenting symptoms include cough hemoptysis. The worrisome physical exam findings include tachycardia, tachypnea, fever. The initial radiographic and laboratory data are worrisome because of possible TB on chest x-ray. The chronic co-morbidities include tobacco use disorder.       * I certify that at the point of admission it is my clinical judgment that the patient will require inpatient hospital care spanning beyond 2 midnights from the point of admission due to high intensity of service, high risk for further deterioration and high frequency of surveillance required.*  Disposition: Anticipated Discharge date 3 to 4  days discharge to home  Time spent in minutes : [place]          Patient ID: SYIRE DACKO, male   DOB: 1981/02/20, 41 y.o.   MRN: [id number]

## 2021-10-18 NOTE — Plan of Care (Signed)

## 2021-10-19 LAB — BASIC METABOLIC PANEL
Anion gap: 7 (ref 5–15)
BUN: 7 mg/dL (ref 6–20)
CO2: 25 mmol/L (ref 22–32)
Calcium: 7.7 mg/dL — ABNORMAL LOW (ref 8.9–10.3)
Chloride: 97 mmol/L — ABNORMAL LOW (ref 98–111)
Creatinine, Ser: 0.61 mg/dL (ref 0.61–1.24)
GFR, Estimated: >60 mL/min (ref >60)
Glucose, Bld: 98 mg/dL (ref 70–99)
Potassium: 3.7 mmol/L (ref 3.5–5.1)
Sodium: 129 mmol/L — ABNORMAL LOW (ref 135–145)

## 2021-10-19 LAB — CBC WITH DIFFERENTIAL/PLATELET
Abs Immature Granulocytes: 0.17 10*3/uL — ABNORMAL HIGH (ref 0.00–0.07)
Basophils Absolute: 0.1 10*3/uL (ref 0.0–0.1)
Basophils Relative: 0 %
Eosinophils Absolute: 0.1 10*3/uL (ref 0.0–0.5)
Eosinophils Relative: 0 %
HCT: 27.6 % — ABNORMAL LOW (ref 39.0–52.0)
Hemoglobin: 9.4 g/dL — ABNORMAL LOW (ref 13.0–17.0)
Immature Granulocytes: 1 %
Lymphocytes Relative: 10 %
Lymphs Abs: 1.4 10*3/uL (ref 0.7–4.0)
MCH: 29.3 pg (ref 26.0–34.0)
MCHC: 34.1 g/dL (ref 30.0–36.0)
MCV: 86 fL (ref 80.0–100.0)
Monocytes Absolute: 1.2 10*3/uL — ABNORMAL HIGH (ref 0.1–1.0)
Monocytes Relative: 8 %
Neutro Abs: 11.9 10*3/uL — ABNORMAL HIGH (ref 1.7–7.7)
Neutrophils Relative %: 81 %
Platelets: 625 10*3/uL — ABNORMAL HIGH (ref 150–400)
RBC: 3.21 MIL/uL — ABNORMAL LOW (ref 4.22–5.81)
RDW: 13.2 % (ref 11.5–15.5)
WBC: 14.8 10*3/uL — ABNORMAL HIGH (ref 4.0–10.5)
nRBC: 0 % (ref 0.0–0.2)

## 2021-10-19 LAB — QUANTIFERON-TB GOLD PLUS (RQFGPL)
QuantiFERON Mitogen Value: 2.91 IU/mL
QuantiFERON Nil Value: 0.01 IU/mL
QuantiFERON TB1 Ag Value: 0.01 IU/mL
QuantiFERON TB2 Ag Value: 0.02 IU/mL

## 2021-10-19 LAB — CULTURE, RESPIRATORY W GRAM STAIN: Culture: NORMAL

## 2021-10-19 LAB — QUANTIFERON-TB GOLD PLUS: QuantiFERON-TB Gold Plus: NEGATIVE

## 2021-10-19 NOTE — Progress Notes (Signed)
Pharmacy Antibiotic Note  Andrew Mccarty is a 41 y.o. male admitted on 10/16/2021 with pneumonia  Pharmacy has been consulted for vancomyicn and cefepime dosing.  Plan: Continue cefepime 2 gms IV q 8 hr. Monitor labs, c/s, and patient improvement.  Height: 6\' 4"  (193 cm) Weight: 63.7 kg (140 lb 6.9 oz) IBW/kg (Calculated) : 86.8  Temp (24hrs), Avg:100.4 F (38 C), Min:99 F (37.2 C), Max:102.6 F (39.2 C)  Recent Labs  Lab 10/16/21 1637 10/16/21 1644 10/16/21 1833 10/17/21 1035 10/18/21 0547 10/19/21 0611  WBC 18.1*  --   --  17.0* 14.8* 14.8*  CREATININE 0.74  --   --  0.82 0.70 0.61  LATICACIDVEN  --  1.5 1.9  --   --   --      Estimated Creatinine Clearance: 109.5 mL/min (by C-G formula based on SCr of 0.61 mg/dL).    Allergies  Allergen Reactions   Lactose Intolerance (Gi) Diarrhea   Morphine And Related     Antimicrobials this admission: Vancomycin  1/23 >> 1/24 Cefepime  1/23 >>  Microbiology results: 1/23 Bcx: ngtd 1/23  sputum cx: gram + cocci in pairs, gram variable rod 1/23 MRSA PCR: neg  Thank you for allowing pharmacy to be a part of this patients care.  2/23 10/19/2021 10:20 AM

## 2021-10-19 NOTE — Progress Notes (Signed)
HPI:    Andrew Mccarty  is a 41 y.o. male, with history of collapsed lung as a teenager and tobacco use disorder presents the ED with a chief complaint of dyspnea and cough.  Patient reports that he started with a common cold about a month ago.  He reports approximately 2 weeks into the course he started having chest pain and presented into the ED.  He was diagnosed with costochondritis which improved with naproxen.  The cough continued.  Over the last week he has had hemoptysis, copious amounts of mucus, he has felt feverish, and has had no appetite.  Patient reports that he is lost 10 pounds in the last week.  He thinks he is lost 20 pounds over the total month.  He was a thin guy to begin with at 6'4" 155 pounds, now down to 135 pounds per his report.  Patient does have a history of pneumothorax but reports that he has never been diagnosed with alpha antitrypsin 1 deficiency or other underlying causes of pneumothorax.  Patient reports that over the last week his fatigue is increased.  He has had loose stools couple times per day.  He cannot remember when his last normal meal was.  He has night sweats that wake him from sleep.  His cough is continuous.  So he decided to come back to the ED.  Patient reports no other complaints at this time.  Patient is a current smoker half a pack per day.  He drinks 3 beers per week.  He uses marijuana but has not used in the last month.  He is not vaccinated for COVID.  Patient is full code.  In the ED T-max 102, respiratory rate up to 32, blood pressure down to 105/74, maintaining oxygen saturations Patient is leukocytosis 18.1 and thrombocytosis 604 Is hyponatremia 122 Troponins flat 7, 7 Lactic acid 1.5 and then 1.9 Negative COVID and flu EKG shows sinus tachycardia with a rate of 127 and QTC 424 Chest x-ray shows right upper lobe opacity is concerning for TB and possible  peritracheal adenopathy Patient was started on vancomycin and cefepime Admission requested for further work-up and management of hemoptysis, right upper lobe pneumonia, weight loss  Subjective No hemoptysis feeling some better no coughing a lot.  Spiked a fever yesterday up to 103    Physical Exam:   Vitals  Blood pressure 123/77, pulse 97, temperature 100 F (37.8 C), temperature source Oral, resp. rate 16, height 6\' 4"  (1.93 m), weight 63.7 kg, SpO2 100 %.   1.  General: Patient lying supine in bed,  no acute distress   2. Psychiatric: Alert and oriented x 3, mood and behavior normal for situation, pleasant and cooperative with exam   3. Neurologic: Speech and language are normal, face is symmetric, moves all 4 extremities voluntarily, at baseline without acute deficits on limited exam   4. HEENMT:  Head is atraumatic, normocephalic, pupils reactive to light, no thyromegaly, no cervical adenopathy, trachea is midline, mucous membranes are moist   5. Respiratory :  Lungs are clear to auscultation bilaterally without wheezing, rhonchi, rales, no cyanosis, no increase in work of breathing or accessory muscle use, brownish expectorated sputum at bedside   6. Cardiovascular : Heart rate tachycardic, rhythm is regular, no murmurs, rubs or gallops, no peripheral edema, peripheral pulses palpated   7. Gastrointestinal:  Abdomen is soft, nondistended, nontender to palpation bowel sounds active, no masses or organomegaly palpated   8. Skin:  Skin is warm, dry and intact without rashes, acute lesions, or ulcers on limited exam   9.Musculoskeletal:  No acute deformities or trauma, no asymmetry in tone, no peripheral edema, peripheral pulses palpated, no tenderness to palpation in the extremities     Data Review:    CBC Recent Labs  Lab 10/16/21 1637 10/17/21 1035 10/18/21 0547 10/19/21 0611  WBC 18.1* 17.0* 14.8* 14.8*  HGB 12.0* 10.9* 10.7* 9.4*  HCT 33.7* 31.6* 31.9*  27.6*  PLT 604* 556* 661* 625*  MCV 82.2 85.6 85.1 86.0  MCH 29.3 29.5 28.5 29.3  MCHC 35.6 34.5 33.5 34.1  RDW 12.6 13.2 13.1 13.2  LYMPHSABS 1.2 1.4  --  1.4  MONOABS 1.7* 1.3*  --  1.2*  EOSABS 0.0 0.1  --  0.1  BASOSABS 0.1 0.1  --  0.1   ------------------------------------------------------------------------------------------------------------------  Results for orders placed or performed during the hospital encounter of 10/16/21 (from the past 48 hour(s))  MRSA Next Gen by PCR, Nasal     Status: None   Collection Time: 10/17/21 10:28 AM   Specimen: Nasal Mucosa; Nasal Swab  Result Value Ref Range   MRSA by PCR Next Gen NOT DETECTED NOT DETECTED    Comment: (NOTE) The GeneXpert MRSA Assay (FDA approved for NASAL specimens only), is one component of a comprehensive MRSA colonization surveillance program. It is not intended to diagnose MRSA infection nor to guide or monitor treatment for MRSA infections. Test performance is not FDA approved in patients less than 5 years old. Performed at [facility], [address], [place], [place] [address]   HIV Antibody (routine testing w rflx)     Status: None   Collection Time: 10/17/21 10:35 AM  Result Value Ref Range   HIV Screen 4th Generation wRfx Non Reactive Non Reactive    Comment: Performed at [facility], [place] [address], [place], [place] [address]  Magnesium     Status: None   Collection Time: 10/17/21 10:35 AM  Result Value Ref Range   Magnesium 2.0 1.7 - 2.4 mg/dL    Comment: Performed at [facility], [address], [place], [place] [address]  CBC WITH DIFFERENTIAL     Status: Abnormal   Collection Time: 10/17/21 10:35 AM  Result Value Ref Range   WBC 17.0 (H) 4.0 - 10.5 K/uL   RBC 3.69 (L) 4.22 - 5.81 MIL/uL   Hemoglobin 10.9 (L) 13.0 - 17.0 g/dL   HCT 31.6 (L) 39.0 - 52.0 %   MCV 85.6 80.0 - 100.0 fL   MCH 29.5 26.0 - 34.0 pg   MCHC 34.5 30.0 - 36.0 g/dL   RDW 13.2 11.5 - 15.5 %   Platelets 556 (H) 150 -  400 K/uL   nRBC 0.0 0.0 - 0.2 %   Neutrophils Relative % 81 %   Neutro Abs 13.7 (H) 1.7 - 7.7 K/uL   Lymphocytes Relative 8 %   Lymphs Abs 1.4 0.7 - 4.0 K/uL   Monocytes Relative 8 %   Monocytes Absolute 1.3 (H) 0.1 - 1.0  K/uL   Eosinophils Relative 0 %   Eosinophils Absolute 0.1 0.0 - 0.5 K/uL   Basophils Relative 1 %   Basophils Absolute 0.1 0.0 - 0.1 K/uL   Immature Granulocytes 2 %   Abs Immature Granulocytes 0.39 (H) 0.00 - 0.07 K/uL    Comment: Performed at [facility], [address] [place], [place] [address]  Comprehensive metabolic panel     Status: Abnormal   Collection Time: 10/17/21 10:35 AM  Result Value Ref Range   Sodium 129 (L) 135 - 145 mmol/L    Comment: DELTA CHECK NOTED   Potassium 3.6 3.5 - 5.1 mmol/L   Chloride 94 (L) 98 - 111 mmol/L   CO2 25 22 - 32 mmol/L   Glucose, Bld 137 (H) 70 - 99 mg/dL    Comment: Glucose reference range applies only to samples taken after fasting for at least 8 hours.   BUN 8 6 - 20 mg/dL   Creatinine, Ser 0.82 0.61 - 1.24 mg/dL   Calcium 8.0 (L) 8.9 - 10.3 mg/dL   Total Protein 6.2 (L) 6.5 - 8.1 g/dL   Albumin 2.1 (L) 3.5 - 5.0 g/dL   AST 44 (H) 15 - 41 U/L   ALT 52 (H) 0 - 44 U/L   Alkaline Phosphatase 73 38 - 126 U/L   Total Bilirubin 0.5 0.3 - 1.2 mg/dL   GFR, Estimated >60 >60 mL/min    Comment: (NOTE) Calculated using the CKD-EPI Creatinine Equation (2021)    Anion gap 10 5 - 15    Comment: Performed at [facility], [address], [place], [place] [id number]  Basic metabolic panel     Status: Abnormal   Collection Time: 10/18/21  5:47 AM  Result Value Ref Range   Sodium 129 (L) 135 - 145 mmol/L   Potassium 3.7 3.5 - 5.1 mmol/L   Chloride 95 (L) 98 - 111 mmol/L   CO2 26 22 - 32 mmol/L   Glucose, Bld 94 70 - 99 mg/dL    Comment: Glucose reference range applies only to samples taken after fasting for at least 8 hours.   BUN 7 6 - 20 mg/dL   Creatinine, Ser 0.70 0.61 - 1.24 mg/dL   Calcium 8.0 (L) 8.9 - 10.3 mg/dL    GFR, Estimated >60 >60 mL/min    Comment: (NOTE) Calculated using the CKD-EPI Creatinine Equation (2021)    Anion gap 8 5 - 15    Comment: Performed at [facility], [address] [place], [place] [address]  CBC     Status: Abnormal   Collection Time: 10/18/21  5:47 AM  Result Value Ref Range   WBC 14.8 (H) 4.0 - 10.5 K/uL   RBC 3.75 (L) 4.22 - 5.81 MIL/uL   Hemoglobin 10.7 (L) 13.0 - 17.0 g/dL   HCT 31.9 (L) 39.0 - 52.0 %   MCV 85.1 80.0 - 100.0 fL   MCH 28.5 26.0 - 34.0 pg   MCHC 33.5 30.0 - 36.0 g/dL   RDW 13.1 11.5 - 15.5 %   Platelets 661 (H) 150 - 400 K/uL   nRBC 0.0 0.0 - 0.2 %    Comment: Performed at [facility], [address] [place],  [address]  Magnesium     Status: None   Collection Time: 10/18/21  5:47 AM  Result Value Ref Range   Magnesium 1.8 1.7 - 2.4 mg/dL    Comment: Performed at [facility], [address], [place],  [address]  Phosphorus     Status: None   Collection Time: 10/18/21  5:47 AM  Result Value Ref Range   Phosphorus 2.8 2.5 - 4.6 mg/dL    Comment: Performed at [facility], [address], [place], [place] [id number]  Basic metabolic panel     Status: Abnormal   Collection Time: 10/19/21  6:11 AM  Result Value Ref Range   Sodium 129 (L) 135 - 145 mmol/L   Potassium 3.7 3.5 - 5.1 mmol/L   Chloride 97 (L) 98 - 111 mmol/L   CO2 25 22 - 32 mmol/L   Glucose, Bld 98 70 - 99 mg/dL    Comment: Glucose reference range applies only to samples taken after fasting for at least 8 hours.   BUN 7 6 - 20 mg/dL   Creatinine, Ser 0.61 0.61 - 1.24 mg/dL   Calcium 7.7 (L) 8.9 - 10.3 mg/dL   GFR, Estimated >60 >60 mL/min    Comment: (NOTE) Calculated using the CKD-EPI Creatinine Equation (2021)    Anion gap 7 5 - 15    Comment: Performed at [facility], [address] [place], [place] [address]  CBC with Differential/Platelet     Status: Abnormal   Collection Time: 10/19/21  6:11 AM  Result Value Ref Range   WBC 14.8 (H) 4.0 - 10.5 K/uL    RBC 3.21 (L) 4.22 - 5.81 MIL/uL   Hemoglobin 9.4 (L) 13.0 - 17.0 g/dL   HCT 27.6 (L) 39.0 - 52.0 %   MCV 86.0 80.0 - 100.0 fL   MCH 29.3 26.0 - 34.0 pg   MCHC 34.1 30.0 - 36.0 g/dL   RDW 13.2 11.5 - 15.5 %   Platelets 625 (H) 150 - 400 K/uL   nRBC 0.0 0.0 - 0.2 %   Neutrophils Relative % 81 %   Neutro Abs 11.9 (H) 1.7 - 7.7 K/uL   Lymphocytes Relative 10 %   Lymphs Abs 1.4 0.7 - 4.0 K/uL   Monocytes Relative 8 %   Monocytes Absolute 1.2 (H) 0.1 - 1.0 K/uL   Eosinophils Relative 0 %   Eosinophils Absolute 0.1 0.0 - 0.5 K/uL   Basophils Relative 0 %   Basophils Absolute 0.1 0.0 - 0.1 K/uL   Immature Granulocytes 1 %   Abs Immature Granulocytes 0.17 (H) 0.00 - 0.07 K/uL    Comment: Performed at [facility], [address] [place], [place] [address]    Chemistries  Recent Labs  Lab 10/16/21 1637 10/17/21 1035 10/18/21 0547 10/19/21 0611  NA 122* 129* 129* 129*  K 3.5 3.6 3.7 3.7  CL 87* 94* 95* 97*  CO2 24 25 26 25   GLUCOSE 135* 137* 94 98  BUN 10 8 7 7   CREATININE 0.74 0.82 0.70 0.61  CALCIUM 8.1* 8.0* 8.0* 7.7*  MG  --  2.0 1.8  --   AST 95* 44*  --   --   ALT 77* 52*  --   --   ALKPHOS 73 73  --   --   BILITOT 0.4 0.5  --   --    ------------------------------------------------------------------------------------------------------------------  ------------------------------------------------------------------------------------------------------------------ GFR: Estimated Creatinine Clearance: 109.5 mL/min (by C-G formula based on SCr of 0.61 mg/dL). Liver Function Tests: Recent Labs  Lab 10/16/21 1637 10/17/21 1035  AST 95* 44*  ALT 77* 52*  ALKPHOS 73 73  BILITOT 0.4 0.5  PROT 7.6 6.2*  ALBUMIN 2.4* 2.1*   No results for input(s): LIPASE, AMYLASE in the last 168 hours. No  results for input(s): AMMONIA in the last 168 hours. Coagulation Profile: Recent Labs  Lab 10/16/21 1651  INR 1.1   Cardiac Enzymes: No results for input(s): CKTOTAL, CKMB,  CKMBINDEX, TROPONINI in the last 168 hours. BNP (last 3 results) No results for input(s): PROBNP in the last 8760 hours. HbA1C: No results for input(s): HGBA1C in the last 72 hours. CBG: No results for input(s): GLUCAP in the last 168 hours. Lipid Profile: No results for input(s): CHOL, HDL, LDLCALC, TRIG, CHOLHDL, LDLDIRECT in the last 72 hours. Thyroid Function Tests: No results for input(s): TSH, T4TOTAL, FREET4, T3FREE, THYROIDAB in the last 72 hours. Anemia Panel: No results for input(s): VITAMINB12, FOLATE, FERRITIN, TIBC, IRON, RETICCTPCT in the last 72 hours.  --------------------------------------------------------------------------------------------------------------- Urine analysis: No results found for: COLORURINE, APPEARANCEUR, LABSPEC, PHURINE, GLUCOSEU, HGBUR, BILIRUBINUR, KETONESUR, PROTEINUR, UROBILINOGEN, NITRITE, LEUKOCYTESUR    Imaging Results:    No results found.     Assessment & Plan:    Principal Problem:   SIRS (systemic inflammatory response syndrome) (HCC) Active Problems:   Hyponatremia   Lobar pneumonia (HCC)   Tobacco use disorder   Underweight   Malnutrition of moderate degree   SIRS  Tachypnea, febrile, leukocytosis, thrombocytosis No evidence of endorgan damage Source of infection is right upper lobe pneumonia Continue broad-spectrum antibiotics Continue IV fluid hydration Trend leukocytosis and thrombocytosis Blood cultures negative thus far Sputum cultures pending Continue to monitor Lobar pneumonia Right upper lobe pneumonia Radiology read-concern for TB Remote risk factor-incarcerated 20 years ago No known exposure Acid-fast stain and culture x3, QuantiFERON gold as recommended by ID Continue broad-spectrum antibiotics cefepime Expectorated sputum pending Blood cultures negative thus far Continue to monitor Tobacco use disorder Counseled on the importance of cessation Nicotine patch ordered Hyponatremia Sodium down to  122 from 137/14 days Likely secondary to poor p.o. intake Continue gentle IV fluids Trend in the a.m. Continue to monitor Underweight Secondary to decreased appetite during infection interval Encourage p.o. intake And Ensure shakes if patient is not keeping up with p.o. intake adequately BMI 17 HIV screen negative   DVT Prophylaxis-   Heparin - SCDs   AM Labs Ordered, also please review Full Orders  Family Communication: No family at bedside Code Status: Full  Admission status: Inpatient :The appropriate admission status for this patient is INPATIENT. Inpatient status is judged to be reasonable and necessary in order to provide the required intensity of service to ensure the patient's safety. The patient's presenting symptoms, physical exam findings, and initial radiographic and laboratory data in the context of their chronic comorbidities is felt to place them at high risk for further clinical deterioration. Furthermore, it is not anticipated that the patient will be medically stable for discharge from the hospital within 2 midnights of admission. The following factors support the admission status of inpatient.     The patient's presenting symptoms include cough hemoptysis. The worrisome physical exam findings include tachycardia, tachypnea, fever. The initial radiographic and laboratory data are worrisome because of possible TB on chest x-ray. The chronic co-morbidities include tobacco use disorder.       * I certify that at the point of admission it is my clinical judgment that the patient will require inpatient hospital care spanning beyond 2 midnights from the point of admission due to high intensity of service, high risk for further deterioration and high frequency of surveillance required.*  Disposition: Anticipated Discharge date 3 to 4 days discharge to home   Hamza Empson A

## 2021-10-20 LAB — CBC WITH DIFFERENTIAL/PLATELET
Abs Immature Granulocytes: 0.19 10*3/uL — ABNORMAL HIGH (ref 0.00–0.07)
Basophils Absolute: 0.1 10*3/uL (ref 0.0–0.1)
Basophils Relative: 1 %
Eosinophils Absolute: 0.1 10*3/uL (ref 0.0–0.5)
Eosinophils Relative: 1 %
HCT: 27.6 % — ABNORMAL LOW (ref 39.0–52.0)
Hemoglobin: 9.6 g/dL — ABNORMAL LOW (ref 13.0–17.0)
Immature Granulocytes: 1 %
Lymphocytes Relative: 10 %
Lymphs Abs: 1.4 10*3/uL (ref 0.7–4.0)
MCH: 29.7 pg (ref 26.0–34.0)
MCHC: 34.8 g/dL (ref 30.0–36.0)
MCV: 85.4 fL (ref 80.0–100.0)
Monocytes Absolute: 1.3 10*3/uL — ABNORMAL HIGH (ref 0.1–1.0)
Monocytes Relative: 10 %
Neutro Abs: 10.4 10*3/uL — ABNORMAL HIGH (ref 1.7–7.7)
Neutrophils Relative %: 77 %
Platelets: 671 10*3/uL — ABNORMAL HIGH (ref 150–400)
RBC: 3.23 MIL/uL — ABNORMAL LOW (ref 4.22–5.81)
RDW: 13.2 % (ref 11.5–15.5)
WBC: 13.5 10*3/uL — ABNORMAL HIGH (ref 4.0–10.5)
nRBC: 0 % (ref 0.0–0.2)

## 2021-10-20 NOTE — Progress Notes (Signed)
HPI:    Andrew Mccarty  is a 41 y.o. male, with history of collapsed lung as a teenager and tobacco use disorder presents the ED with a chief complaint of dyspnea and cough.  Patient reports that he started with a common cold about a month ago.  He reports approximately 2 weeks into the course he started having chest pain and presented into the ED.  He was diagnosed with costochondritis which improved with naproxen.  The cough continued.  Over the last week he has had hemoptysis, copious amounts of mucus, he has felt feverish, and has had no appetite.  Patient reports that he is lost 10 pounds in the last week.  He thinks he is lost 20 pounds over the total month.  He was a thin guy to begin with at 6'4" 155 pounds, now down to 135 pounds per his report.  Patient does have a history of pneumothorax but reports that he has never been diagnosed with alpha antitrypsin 1 deficiency or other underlying causes of pneumothorax.  Patient reports that over the last week his fatigue is increased.  He has had loose stools couple times per day.  He cannot remember when his last normal meal was.  He has night sweats that wake him from sleep.  His cough is continuous.  So he decided to come back to the ED.  Patient reports no other complaints at this time.  Patient is a current smoker half a pack per day.  He drinks 3 beers per week.  He uses marijuana but has not used in the last month.  He is not vaccinated for COVID.  Patient is full code.  In the ED T-max 102, respiratory rate up to 32, blood pressure down to 105/74, maintaining oxygen saturations Patient is leukocytosis 18.1 and thrombocytosis 604 Is hyponatremia 122 Troponins flat 7, 7 Lactic acid 1.5 and then 1.9 Negative COVID and flu EKG shows sinus tachycardia with a rate of 127 and QTC 424 Chest x-ray shows right upper lobe opacity is concerning for TB and possible  peritracheal adenopathy Patient was started on vancomycin and cefepime Admission requested for further work-up and management of hemoptysis, right upper lobe pneumonia, weight loss  Subjective Feeling better.  Feeling like he is going stir crazy cooped up in this room by himself.  In negative pressure room for TB rule out.   Physical Exam:   Vitals  Blood pressure 131/86, pulse 90, temperature 100.3 F (37.9 C), temperature source Oral, resp. rate 17, height 6\' 4"  (1.93 m), weight 63.7 kg, SpO2 100 %.   1.  General: Patient lying supine in bed,  no acute distress   2. Psychiatric: Alert and oriented x 3, mood and behavior normal for situation, pleasant and cooperative with exam   3. Neurologic: Speech and language are normal, face is symmetric, moves all 4 extremities voluntarily, at baseline without acute deficits on limited exam   4. HEENMT:  Head is atraumatic, normocephalic, pupils reactive to light, no thyromegaly, no cervical adenopathy, trachea is midline, mucous  membranes are moist   5. Respiratory : Lungs are clear to auscultation bilaterally without wheezing, rhonchi, rales, no cyanosis, no increase in work of breathing or accessory muscle use, brownish expectorated sputum at bedside   6. Cardiovascular : Heart rate tachycardic, rhythm is regular, no murmurs, rubs or gallops, no peripheral edema, peripheral pulses palpated   7. Gastrointestinal:  Abdomen is soft, nondistended, nontender to palpation bowel sounds active, no masses or organomegaly palpated   8. Skin:  Skin is warm, dry and intact without rashes, acute lesions, or ulcers on limited exam   9.Musculoskeletal:  No acute deformities or trauma, no asymmetry in tone, no peripheral edema, peripheral pulses palpated, no tenderness to palpation in the extremities     Data Review:    CBC Recent Labs  Lab 10/16/21 1637 10/17/21 1035 10/18/21 0547 10/19/21 0611 10/20/21 0530  WBC 18.1* 17.0* 14.8*  14.8* 13.5*  HGB 12.0* 10.9* 10.7* 9.4* 9.6*  HCT 33.7* 31.6* 31.9* 27.6* 27.6*  PLT 604* 556* 661* 625* 671*  MCV 82.2 85.6 85.1 86.0 85.4  MCH 29.3 29.5 28.5 29.3 29.7  MCHC 35.6 34.5 33.5 34.1 34.8  RDW 12.6 13.2 13.1 13.2 13.2  LYMPHSABS 1.2 1.4  --  1.4 1.4  MONOABS 1.7* 1.3*  --  1.2* 1.3*  EOSABS 0.0 0.1  --  0.1 0.1  BASOSABS 0.1 0.1  --  0.1 0.1   ------------------------------------------------------------------------------------------------------------------  Results for orders placed or performed during the hospital encounter of 10/16/21 (from the past 48 hour(s))  Basic metabolic panel     Status: Abnormal   Collection Time: 10/19/21  6:11 AM  Result Value Ref Range   Sodium 129 (L) 135 - 145 mmol/L   Potassium 3.7 3.5 - 5.1 mmol/L   Chloride 97 (L) 98 - 111 mmol/L   CO2 25 22 - 32 mmol/L   Glucose, Bld 98 70 - 99 mg/dL    Comment: Glucose reference range applies only to samples taken after fasting for at least 8 hours.   BUN 7 6 - 20 mg/dL   Creatinine, Ser 1.610.61 0.61 - 1.24 mg/dL   Calcium 7.7 (L) 8.9 - 10.3 mg/dL   GFR, Estimated >09>60 >60>60 mL/min    Comment: (NOTE) Calculated using the CKD-EPI Creatinine Equation (2021)    Anion gap 7 5 - 15    Comment: Performed at [facility], [address] [place], [place] [address]  CBC with Differential/Platelet     Status: Abnormal   Collection Time: 10/19/21  6:11 AM  Result Value Ref Range   WBC 14.8 (H) 4.0 - 10.5 K/uL   RBC 3.21 (L) 4.22 - 5.81 MIL/uL   Hemoglobin 9.4 (L) 13.0 - 17.0 g/dL   HCT 81.127.6 (L) 91.439.0 - 78.252.0 %   MCV 86.0 80.0 - 100.0 fL   MCH 29.3 26.0 - 34.0 pg   MCHC 34.1 30.0 - 36.0 g/dL   RDW 95.613.2 21.311.5 - 08.615.5 %   Platelets 625 (H) 150 - 400 K/uL   nRBC 0.0 0.0 - 0.2 %   Neutrophils Relative % 81 %   Neutro Abs 11.9 (H) 1.7 - 7.7 K/uL   Lymphocytes Relative 10 %   Lymphs Abs 1.4 0.7 - 4.0 K/uL   Monocytes Relative 8 %   Monocytes Absolute 1.2 (H) 0.1 - 1.0 K/uL   Eosinophils Relative 0 %    Eosinophils Absolute 0.1 0.0 - 0.5 K/uL   Basophils Relative 0 %   Basophils Absolute 0.1 0.0 -  0.1 K/uL   Immature Granulocytes 1 %   Abs Immature Granulocytes 0.17 (H) 0.00 - 0.07 K/uL    Comment: Performed at [facility], [address] [place], [place] [address]  CBC with Differential/Platelet     Status: Abnormal   Collection Time: 10/20/21  5:30 AM  Result Value Ref Range   WBC 13.5 (H) 4.0 - 10.5 K/uL   RBC 3.23 (L) 4.22 - 5.81 MIL/uL   Hemoglobin 9.6 (L) 13.0 - 17.0 g/dL   HCT 84.6 (L) 65.9 - 93.5 %   MCV 85.4 80.0 - 100.0 fL   MCH 29.7 26.0 - 34.0 pg   MCHC 34.8 30.0 - 36.0 g/dL   RDW 70.1 77.9 - 39.0 %   Platelets 671 (H) 150 - 400 K/uL   nRBC 0.0 0.0 - 0.2 %   Neutrophils Relative % 77 %   Neutro Abs 10.4 (H) 1.7 - 7.7 K/uL   Lymphocytes Relative 10 %   Lymphs Abs 1.4 0.7 - 4.0 K/uL   Monocytes Relative 10 %   Monocytes Absolute 1.3 (H) 0.1 - 1.0 K/uL   Eosinophils Relative 1 %   Eosinophils Absolute 0.1 0.0 - 0.5 K/uL   Basophils Relative 1 %   Basophils Absolute 0.1 0.0 - 0.1 K/uL   Immature Granulocytes 1 %   Abs Immature Granulocytes 0.19 (H) 0.00 - 0.07 K/uL    Comment: Performed at [facility], [address], [place], [place] [address]    Chemistries  Recent Labs  Lab 10/16/21 1637 10/17/21 1035 10/18/21 0547 10/19/21 0611  NA 122* 129* 129* 129*  K 3.5 3.6 3.7 3.7  CL 87* 94* 95* 97*  CO2 24 25 26 25   GLUCOSE 135* 137* 94 98  BUN 10 8 7 7   CREATININE 0.74 0.82 0.70 0.61  CALCIUM 8.1* 8.0* 8.0* 7.7*  MG  --  2.0 1.8  --   AST 95* 44*  --   --   ALT 77* 52*  --   --   ALKPHOS 73 73  --   --   BILITOT 0.4 0.5  --   --    ------------------------------------------------------------------------------------------------------------------  ------------------------------------------------------------------------------------------------------------------ GFR: Estimated Creatinine Clearance: 109.5 mL/min (by C-G formula based on SCr of 0.61  mg/dL). Liver Function Tests: Recent Labs  Lab 10/16/21 1637 10/17/21 1035  AST 95* 44*  ALT 77* 52*  ALKPHOS 73 73  BILITOT 0.4 0.5  PROT 7.6 6.2*  ALBUMIN 2.4* 2.1*   No results for input(s): LIPASE, AMYLASE in the last 168 hours. No results for input(s): AMMONIA in the last 168 hours. Coagulation Profile: Recent Labs  Lab 10/16/21 1651  INR 1.1   Cardiac Enzymes: No results for input(s): CKTOTAL, CKMB, CKMBINDEX, TROPONINI in the last 168 hours. BNP (last 3 results) No results for input(s): PROBNP in the last 8760 hours. HbA1C: No results for input(s): HGBA1C in the last 72 hours. CBG: No results for input(s): GLUCAP in the last 168 hours. Lipid Profile: No results for input(s): CHOL, HDL, LDLCALC, TRIG, CHOLHDL, LDLDIRECT in the last 72 hours. Thyroid Function Tests: No results for input(s): TSH, T4TOTAL, FREET4, T3FREE, THYROIDAB in the last 72 hours. Anemia Panel: No results for input(s): VITAMINB12, FOLATE, FERRITIN, TIBC, IRON, RETICCTPCT in the last 72 hours.  --------------------------------------------------------------------------------------------------------------- Urine analysis: No results found for: COLORURINE, APPEARANCEUR, LABSPEC, PHURINE, GLUCOSEU, HGBUR, BILIRUBINUR, KETONESUR, PROTEINUR, UROBILINOGEN, NITRITE, LEUKOCYTESUR    Imaging Results:    No results found.     Assessment & Plan:  Principal Problem:   SIRS (systemic inflammatory response syndrome) (HCC) Active Problems:   Hyponatremia   Lobar pneumonia (HCC)   Tobacco use disorder   Underweight   Malnutrition of moderate degree   SIRS  Tachypnea, febrile, leukocytosis, thrombocytosis No evidence of endorgan damage Source of infection is right upper lobe pneumonia Continue broad-spectrum antibiotics Continue IV fluid hydration Trend leukocytosis and thrombocytosis Blood cultures negative thus far Sputum cultures pending Continue to monitor Lobar pneumonia Right upper  lobe pneumonia Radiology read-concern for TB Remote risk factor-incarcerated 20 years ago No known exposure Acid-fast stain and culture x3 pending, QuantiFERON gold negative as recommended by ID Continue broad-spectrum antibiotics cefepime Expectorated sputum pending Blood cultures negative thus far Continue to monitor Tobacco use disorder Counseled on the importance of cessation Nicotine patch ordered Hyponatremia Sodium down to 122 from 137/14 days Likely secondary to poor p.o. intake Continue gentle IV fluids Trend in the a.m. Continue to monitor Underweight Secondary to decreased appetite during infection interval Encourage p.o. intake And Ensure shakes if patient is not keeping up with p.o. intake adequately BMI 17 HIV screen negative  Still awaiting AFB smears once all negative he can be discharged on oral antibiotics    Jerica Creegan A         Patient ID: Vara GuardianMcKinley L Demos, male   DOB: [birth year], 41 y.o.   MRN: [id number]

## 2021-10-21 LAB — CULTURE, BLOOD (ROUTINE X 2)
Culture: NO GROWTH
Culture: NO GROWTH
Special Requests: ADEQUATE
Special Requests: ADEQUATE

## 2021-10-21 LAB — CBC WITH DIFFERENTIAL/PLATELET
Abs Immature Granulocytes: 0.16 10*3/uL — ABNORMAL HIGH (ref 0.00–0.07)
Basophils Absolute: 0.1 10*3/uL (ref 0.0–0.1)
Basophils Relative: 1 %
Eosinophils Absolute: 0.1 10*3/uL (ref 0.0–0.5)
Eosinophils Relative: 1 %
HCT: 26.5 % — ABNORMAL LOW (ref 39.0–52.0)
Hemoglobin: 8.8 g/dL — ABNORMAL LOW (ref 13.0–17.0)
Immature Granulocytes: 1 %
Lymphocytes Relative: 11 %
Lymphs Abs: 1.3 10*3/uL (ref 0.7–4.0)
MCH: 28.1 pg (ref 26.0–34.0)
MCHC: 33.2 g/dL (ref 30.0–36.0)
MCV: 84.7 fL (ref 80.0–100.0)
Monocytes Absolute: 1.4 10*3/uL — ABNORMAL HIGH (ref 0.1–1.0)
Monocytes Relative: 11 %
Neutro Abs: 9.3 10*3/uL — ABNORMAL HIGH (ref 1.7–7.7)
Neutrophils Relative %: 75 %
Platelets: 685 10*3/uL — ABNORMAL HIGH (ref 150–400)
RBC: 3.13 MIL/uL — ABNORMAL LOW (ref 4.22–5.81)
RDW: 13.1 % (ref 11.5–15.5)
WBC: 12.3 10*3/uL — ABNORMAL HIGH (ref 4.0–10.5)
nRBC: 0 % (ref 0.0–0.2)

## 2021-10-21 LAB — PROCALCITONIN: Procalcitonin: 0.43 ng/mL

## 2021-10-21 NOTE — Plan of Care (Signed)

## 2021-10-21 NOTE — Progress Notes (Signed)
HPI:    Andrew Mccarty  is a 41 y.o. male, with history of collapsed lung as a teenager and tobacco use disorder presents the ED with a chief complaint of dyspnea and cough.  Patient reports that he started with a common cold about a month ago.  He reports approximately 2 weeks into the course he started having chest pain and presented into the ED.  He was diagnosed with costochondritis which improved with naproxen.  The cough continued.  Over the last week he has had hemoptysis, copious amounts of mucus, he has felt feverish, and has had no appetite.  Patient reports that he is lost 10 pounds in the last week.  He thinks he is lost 20 pounds over the total month.  He was a thin guy to begin with at 6'4" 155 pounds, now down to 135 pounds per his report.  Patient does have a history of pneumothorax but reports that he has never been diagnosed with alpha antitrypsin 1 deficiency or other underlying causes of pneumothorax.  Patient reports that over the last week his fatigue is increased.  He has had loose stools couple times per day.  He cannot remember when his last normal meal was.  He has night sweats that wake him from sleep.  His cough is continuous.  So he decided to come back to the ED.  Patient reports no other complaints at this time.  Patient is a current smoker half a pack per day.  He drinks 3 beers per week.  He uses marijuana but has not used in the last month.  He is not vaccinated for COVID.  Patient is full code.  In the ED T-max 102, respiratory rate up to 32, blood pressure down to 105/74, maintaining oxygen saturations Patient is leukocytosis 18.1 and thrombocytosis 604 Is hyponatremia 122 Troponins flat 7, 7 Lactic acid 1.5 and then 1.9 Negative COVID and flu EKG shows sinus tachycardia with a rate of 127 and QTC 424 Chest x-ray shows right upper lobe opacity is concerning for TB and possible  peritracheal adenopathy Patient was started on vancomycin and cefepime Admission requested for further work-up and management of hemoptysis, right upper lobe pneumonia, weight loss  Subjective Ready to be discharged   Physical Exam:   Vitals  Blood pressure 120/76, pulse 89, temperature 99 F (37.2 C), temperature source Oral, resp. rate 16, height 6\' 4"  (1.93 m), weight 63.7 kg, SpO2 100 %.   1.  General: Patient lying supine in bed,  no acute distress   2. Psychiatric: Alert and oriented x 3, mood and behavior normal for situation, pleasant and cooperative with exam   3. Neurologic: Speech and language are normal, face is symmetric, moves all 4 extremities voluntarily, at baseline without acute deficits on limited exam   4. HEENMT:  Head is atraumatic, normocephalic, pupils reactive to light, no thyromegaly, no cervical adenopathy, trachea is midline, mucous membranes are moist   5. Respiratory : Lungs are clear to auscultation bilaterally without wheezing, rhonchi, rales, no cyanosis, no increase  in work of breathing or accessory muscle use, brownish expectorated sputum at bedside   6. Cardiovascular : Heart rate tachycardic, rhythm is regular, no murmurs, rubs or gallops, no peripheral edema, peripheral pulses palpated   7. Gastrointestinal:  Abdomen is soft, nondistended, nontender to palpation bowel sounds active, no masses or organomegaly palpated   8. Skin:  Skin is warm, dry and intact without rashes, acute lesions, or ulcers on limited exam   9.Musculoskeletal:  No acute deformities or trauma, no asymmetry in tone, no peripheral edema, peripheral pulses palpated, no tenderness to palpation in the extremities     Data Review:    CBC Recent Labs  Lab 10/16/21 1637 10/17/21 1035 10/18/21 0547 10/19/21 0611 10/20/21 0530 10/21/21 0515  WBC 18.1* 17.0* 14.8* 14.8* 13.5* 12.3*  HGB 12.0* 10.9* 10.7* 9.4* 9.6* 8.8*  HCT 33.7* 31.6* 31.9* 27.6* 27.6* 26.5*   PLT 604* 556* 661* 625* 671* 685*  MCV 82.2 85.6 85.1 86.0 85.4 84.7  MCH 29.3 29.5 28.5 29.3 29.7 28.1  MCHC 35.6 34.5 33.5 34.1 34.8 33.2  RDW 12.6 13.2 13.1 13.2 13.2 13.1  LYMPHSABS 1.2 1.4  --  1.4 1.4 1.3  MONOABS 1.7* 1.3*  --  1.2* 1.3* 1.4*  EOSABS 0.0 0.1  --  0.1 0.1 0.1  BASOSABS 0.1 0.1  --  0.1 0.1 0.1   ------------------------------------------------------------------------------------------------------------------  Results for orders placed or performed during the hospital encounter of 10/16/21 (from the past 48 hour(s))  CBC with Differential/Platelet     Status: Abnormal   Collection Time: 10/20/21  5:30 AM  Result Value Ref Range   WBC 13.5 (H) 4.0 - 10.5 K/uL   RBC 3.23 (L) 4.22 - 5.81 MIL/uL   Hemoglobin 9.6 (L) 13.0 - 17.0 g/dL   HCT 78.2 (L) 95.6 - 21.3 %   MCV 85.4 80.0 - 100.0 fL   MCH 29.7 26.0 - 34.0 pg   MCHC 34.8 30.0 - 36.0 g/dL   RDW 08.6 57.8 - 46.9 %   Platelets 671 (H) 150 - 400 K/uL   nRBC 0.0 0.0 - 0.2 %   Neutrophils Relative % 77 %   Neutro Abs 10.4 (H) 1.7 - 7.7 K/uL   Lymphocytes Relative 10 %   Lymphs Abs 1.4 0.7 - 4.0 K/uL   Monocytes Relative 10 %   Monocytes Absolute 1.3 (H) 0.1 - 1.0 K/uL   Eosinophils Relative 1 %   Eosinophils Absolute 0.1 0.0 - 0.5 K/uL   Basophils Relative 1 %   Basophils Absolute 0.1 0.0 - 0.1 K/uL   Immature Granulocytes 1 %   Abs Immature Granulocytes 0.19 (H) 0.00 - 0.07 K/uL    Comment: Performed at [facility], [address] [place], [place] [address]  CBC with Differential/Platelet     Status: Abnormal   Collection Time: 10/21/21  5:15 AM  Result Value Ref Range   WBC 12.3 (H) 4.0 - 10.5 K/uL   RBC 3.13 (L) 4.22 - 5.81 MIL/uL   Hemoglobin 8.8 (L) 13.0 - 17.0 g/dL   HCT 84.1 (L) 32.4 - 40.1 %   MCV 84.7 80.0 - 100.0 fL   MCH 28.1 26.0 - 34.0 pg   MCHC 33.2 30.0 - 36.0 g/dL   RDW 02.7 25.3 - 66.4 %   Platelets 685 (H) 150 - 400 K/uL   nRBC 0.0 0.0 - 0.2 %   Neutrophils Relative % 75 %    Neutro Abs 9.3 (H) 1.7 - 7.7 K/uL   Lymphocytes  Relative 11 %   Lymphs Abs 1.3 0.7 - 4.0 K/uL   Monocytes Relative 11 %   Monocytes Absolute 1.4 (H) 0.1 - 1.0 K/uL   Eosinophils Relative 1 %   Eosinophils Absolute 0.1 0.0 - 0.5 K/uL   Basophils Relative 1 %   Basophils Absolute 0.1 0.0 - 0.1 K/uL   Immature Granulocytes 1 %   Abs Immature Granulocytes 0.16 (H) 0.00 - 0.07 K/uL    Comment: Performed at [facility], [address], [place], [place] [address]  Procalcitonin - Baseline     Status: None   Collection Time: 10/21/21  8:07 AM  Result Value Ref Range   Procalcitonin 0.43 ng/mL    Comment:        Interpretation: PCT (Procalcitonin) <= 0.5 ng/mL: Systemic infection (sepsis) is not likely. Local bacterial infection is possible. (NOTE)       Sepsis PCT Algorithm           Lower Respiratory Tract                                      Infection PCT Algorithm    ----------------------------     ----------------------------         PCT < 0.25 ng/mL                PCT < 0.10 ng/mL          Strongly encourage             Strongly discourage   discontinuation of antibiotics    initiation of antibiotics    ----------------------------     -----------------------------       PCT 0.25 - 0.50 ng/mL            PCT 0.10 - 0.25 ng/mL               OR       >80% decrease in PCT            Discourage initiation of                                            antibiotics      Encourage discontinuation           of antibiotics    ----------------------------     -----------------------------         PCT >= 0.50 ng/mL              PCT 0.26 - 0.50 ng/mL               AND        <80% decrease in PCT             Encourage initiation of                                             antibiotics       Encourage continuation           of antibiotics    ----------------------------     -----------------------------        PCT >= 0.50 ng/mL  PCT > 0.50 ng/mL               AND          increase in PCT                  Strongly encourage                                      initiation of antibiotics    Strongly encourage escalation           of antibiotics                                     -----------------------------                                           PCT <= 0.25 ng/mL                                                 OR                                        > 80% decrease in PCT                                      Discontinue / Do not initiate                                             antibiotics  Performed at [facility], [address] [place], [place] [address]     Chemistries  Recent Labs  Lab 10/16/21 1637 10/17/21 1035 10/18/21 0547 10/19/21 0611  NA 122* 129* 129* 129*  K 3.5 3.6 3.7 3.7  CL 87* 94* 95* 97*  CO2 24 25 26 25   GLUCOSE 135* 137* 94 98  BUN 10 8 7 7   CREATININE 0.74 0.82 0.70 0.61  CALCIUM 8.1* 8.0* 8.0* 7.7*  MG  --  2.0 1.8  --   AST 95* 44*  --   --   ALT 77* 52*  --   --   ALKPHOS 73 73  --   --   BILITOT 0.4 0.5  --   --    ------------------------------------------------------------------------------------------------------------------  ------------------------------------------------------------------------------------------------------------------ GFR: Estimated Creatinine Clearance: 109.5 mL/min (by C-G formula based on SCr of 0.61 mg/dL). Liver Function Tests: Recent Labs  Lab 10/16/21 1637 10/17/21 1035  AST 95* 44*  ALT 77* 52*  ALKPHOS 73 73  BILITOT 0.4 0.5  PROT 7.6 6.2*  ALBUMIN 2.4* 2.1*   No results for input(s): LIPASE, AMYLASE in the last 168 hours. No results for input(s): AMMONIA in the last 168 hours. Coagulation Profile: Recent Labs  Lab 10/16/21 1651  INR 1.1   Cardiac Enzymes: No results for input(s): CKTOTAL, CKMB, CKMBINDEX, TROPONINI  in the last 168 hours. BNP (last 3 results) No results for input(s): PROBNP in the last 8760 hours. HbA1C: No results for input(s): HGBA1C  in the last 72 hours. CBG: No results for input(s): GLUCAP in the last 168 hours. Lipid Profile: No results for input(s): CHOL, HDL, LDLCALC, TRIG, CHOLHDL, LDLDIRECT in the last 72 hours. Thyroid Function Tests: No results for input(s): TSH, T4TOTAL, FREET4, T3FREE, THYROIDAB in the last 72 hours. Anemia Panel: No results for input(s): VITAMINB12, FOLATE, FERRITIN, TIBC, IRON, RETICCTPCT in the last 72 hours.  --------------------------------------------------------------------------------------------------------------- Urine analysis: No results found for: COLORURINE, APPEARANCEUR, LABSPEC, PHURINE, GLUCOSEU, HGBUR, BILIRUBINUR, KETONESUR, PROTEINUR, UROBILINOGEN, NITRITE, LEUKOCYTESUR    Imaging Results:    No results found.     Assessment & Plan:    Principal Problem:   SIRS (systemic inflammatory response syndrome) (HCC) Active Problems:   Hyponatremia   Lobar pneumonia (HCC)   Tobacco use disorder   Underweight   Malnutrition of moderate degree   SIRS  Tachypnea, febrile, leukocytosis, thrombocytosis No evidence of endorgan damage Source of infection is right upper lobe pneumonia Continue broad-spectrum antibiotics Continue IV fluid hydration Trend leukocytosis and thrombocytosis Blood cultures negative thus far Sputum cultures pending Continue to monitor Lobar pneumonia Right upper lobe pneumonia Radiology read-concern for TB Remote risk factor-incarcerated 20 years ago No known exposure Acid-fast stain and culture x3 pending, QuantiFERON gold negative as recommended by ID Continue broad-spectrum antibiotics cefepime Expectorated sputum pending Blood cultures negative thus far Continue to monitor Tobacco use disorder Counseled on the importance of cessation Nicotine patch ordered Hyponatremia Sodium down to 122 from 137/14 days Likely secondary to poor p.o. intake Continue gentle IV fluids Trend in the a.m. Continue to  monitor Underweight Secondary to decreased appetite during infection interval Encourage p.o. intake And Ensure shakes if patient is not keeping up with p.o. intake adequately BMI 17 HIV screen negative  Still awaiting AFB smears once all negative he can be discharged on oral antibiotics    Melanie Openshaw A

## 2021-10-22 ENCOUNTER — Inpatient Hospital Stay (HOSPITAL_COMMUNITY): Payer: Self-pay

## 2021-10-22 DIAGNOSIS — E440 Moderate protein-calorie malnutrition: Secondary | ICD-10-CM

## 2021-10-22 DIAGNOSIS — J189 Pneumonia, unspecified organism: Secondary | ICD-10-CM

## 2021-10-22 DIAGNOSIS — R61 Generalized hyperhidrosis: Secondary | ICD-10-CM

## 2021-10-22 DIAGNOSIS — R058 Other specified cough: Secondary | ICD-10-CM

## 2021-10-22 LAB — CBC WITH DIFFERENTIAL/PLATELET
Abs Immature Granulocytes: 0.1 10*3/uL — ABNORMAL HIGH (ref 0.00–0.07)
Basophils Absolute: 0.1 10*3/uL (ref 0.0–0.1)
Basophils Relative: 1 %
Eosinophils Absolute: 0.1 10*3/uL (ref 0.0–0.5)
Eosinophils Relative: 1 %
HCT: 26.6 % — ABNORMAL LOW (ref 39.0–52.0)
Hemoglobin: 8.7 g/dL — ABNORMAL LOW (ref 13.0–17.0)
Immature Granulocytes: 1 %
Lymphocytes Relative: 14 %
Lymphs Abs: 1.5 10*3/uL (ref 0.7–4.0)
MCH: 28 pg (ref 26.0–34.0)
MCHC: 32.7 g/dL (ref 30.0–36.0)
MCV: 85.5 fL (ref 80.0–100.0)
Monocytes Absolute: 1.4 10*3/uL — ABNORMAL HIGH (ref 0.1–1.0)
Monocytes Relative: 13 %
Neutro Abs: 7.7 10*3/uL (ref 1.7–7.7)
Neutrophils Relative %: 70 %
Platelets: 753 10*3/uL — ABNORMAL HIGH (ref 150–400)
RBC: 3.11 MIL/uL — ABNORMAL LOW (ref 4.22–5.81)
RDW: 13.1 % (ref 11.5–15.5)
WBC: 10.8 10*3/uL — ABNORMAL HIGH (ref 4.0–10.5)
nRBC: 0 % (ref 0.0–0.2)

## 2021-10-22 LAB — EXPECTORATED SPUTUM ASSESSMENT W GRAM STAIN, RFLX TO RESP C

## 2021-10-22 LAB — URINALYSIS, ROUTINE W REFLEX MICROSCOPIC
Bilirubin Urine: NEGATIVE
Glucose, UA: NEGATIVE mg/dL
Hgb urine dipstick: NEGATIVE
Ketones, ur: NEGATIVE mg/dL
Leukocytes,Ua: NEGATIVE
Nitrite: NEGATIVE
Protein, ur: NEGATIVE mg/dL
Specific Gravity, Urine: 1.02 (ref 1.005–1.030)
pH: 6 (ref 5.0–8.0)

## 2021-10-22 LAB — STREP PNEUMONIAE URINARY ANTIGEN: Strep Pneumo Urinary Antigen: NEGATIVE

## 2021-10-22 MED ORDER — AMOXICILLIN-POT CLAVULANATE 875-125 MG PO TABS
1.0000 | ORAL_TABLET | Freq: Two times a day (BID) | ORAL | Status: DC
  Administered 2021-10-22 – 2021-10-25 (×7): 1 via ORAL
  Filled 2021-10-22 (×7): qty 1

## 2021-10-22 MED ORDER — IOHEXOL 300 MG/ML  SOLN
75.0000 mL | Freq: Once | INTRAMUSCULAR | Status: AC | PRN
  Administered 2021-10-22: 75 mL via INTRAVENOUS

## 2021-10-22 NOTE — Progress Notes (Deleted)
Pharmacy Antibiotic Note  Andrew Mccarty is a 41 y.o. male admitted on 10/16/2021 with pneumonia  Pharmacy has been consulted for vancomyicn and cefepime dosing.  Plan: Continue cefepime 2 gms IV q 8 hr. Monitor labs, c/s, and patient improvement.  Height: 6\' 4"  (193 cm) Weight: 63.7 kg (140 lb 6.9 oz) IBW/kg (Calculated) : 86.8  Temp (24hrs), Avg:99.5 F (37.5 C), Min:98.4 F (36.9 C), Max:100.7 F (38.2 C)  Recent Labs  Lab 10/16/21 1637 10/16/21 1644 10/16/21 1833 10/17/21 1035 10/18/21 0547 10/19/21 0611 10/20/21 0530 10/21/21 0515 10/22/21 0551  WBC 18.1*  --   --  17.0* 14.8* 14.8* 13.5* 12.3* 10.8*  CREATININE 0.74  --   --  0.82 0.70 0.61  --   --   --   LATICACIDVEN  --  1.5 1.9  --   --   --   --   --   --      Estimated Creatinine Clearance: 109.5 mL/min (by C-G formula based on SCr of 0.61 mg/dL).    Allergies  Allergen Reactions   Lactose Intolerance (Gi) Diarrhea   Morphine And Related     Antimicrobials this admission: Vancomycin  1/23 >> 1/24 Cefepime  1/23 >>  Microbiology results: 1/23 Bcx: ngtd 1/23  sputum cx: gram + cocci in pairs, gram variable rod 1/23 MRSA PCR: neg  Thank you for allowing pharmacy to be a part of this patients care.  2/23 Andrew Mccarty 10/22/2021 1:57 PM

## 2021-10-22 NOTE — Progress Notes (Addendum)
HPI:    Andrew Mccarty  is a 41 y.o. male, with history of collapsed lung as a teenager and tobacco use disorder presents the ED with a chief complaint of dyspnea and cough.  He reports approximately 2 weeks into the course he started having chest pain and presented into the ED.  He was diagnosed with costochondritis which improved with naproxen.  The cough continued.  Over the last week he has had hemoptysis, copious amounts of purulent mucus, he has felt feverish, and has had no appetite.  Patient reports that he is lost 10 pounds in the last week.  He thinks he is lost 20 pounds over the total month.   - Patient does have a history of pneumothorax but reports that he has never been diagnosed with alpha antitrypsin 1 deficiency or other underlying causes of pneumothorax.   -continues with intermittent fever and copious amounts of purulent fold smelling sputum production   Subjective Concerned about the amount of mucous he is producing and how bad it smells   Physical Exam:   Vitals Vitals:   10/21/21 1406 10/21/21 2153 10/22/21 0559 10/22/21 0958  BP: 120/76 107/71 118/77   Pulse: 89 92 87   Resp: 16 17 17    Temp: 99 F (37.2 C) 98.4 F (36.9 C) 99.7 F (37.6 C) (!) 100.7 F (38.2 C)  TempSrc: Oral  Oral Oral  SpO2: 100% 99% 99%   Weight:      Height:         General: Appearance:    Thin male in no acute distress     Lungs:     Diminished, no wheezing, respirations unlabored  Heart:    Normal heart rate.    MS:   All extremities are intact.    Neurologic:   Awake, alert, oriented x 3. No apparent focal neurological           defect.       Data Review:    CBC Recent Labs  Lab 10/17/21 1035 10/18/21 0547 10/19/21 0611 10/20/21 0530 10/21/21 0515 10/22/21 0551  WBC 17.0* 14.8* 14.8* 13.5* 12.3* 10.8*  HGB 10.9* 10.7* 9.4* 9.6* 8.8* 8.7*  HCT 31.6* 31.9* 27.6* 27.6* 26.5* 26.6*  PLT  556* 661* 625* 671* 685* 753*  MCV 85.6 85.1 86.0 85.4 84.7 85.5  MCH 29.5 28.5 29.3 29.7 28.1 28.0  MCHC 34.5 33.5 34.1 34.8 33.2 32.7  RDW 13.2 13.1 13.2 13.2 13.1 13.1  LYMPHSABS 1.4  --  1.4 1.4 1.3 1.5  MONOABS 1.3*  --  1.2* 1.3* 1.4* 1.4*  EOSABS 0.1  --  0.1 0.1 0.1 0.1  BASOSABS 0.1  --  0.1 0.1 0.1 0.1   ------------------------------------------------------------------------------------------------------------------    Chemistries  Recent Labs  Lab 10/16/21 1637 10/17/21 1035 10/18/21 0547 10/19/21 0611  NA 122* 129* 129* 129*  K 3.5 3.6 3.7 3.7  CL 87* 94* 95* 97*  CO2 24 25 26 25   GLUCOSE 135* 137* 94 98  BUN 10 8 7 7   CREATININE 0.74 0.82  0.70 0.61  CALCIUM 8.1* 8.0* 8.0* 7.7*  MG  --  2.0 1.8  --   AST 95* 44*  --   --   ALT 77* 52*  --   --   ALKPHOS 73 73  --   --   BILITOT 0.4 0.5  --   --    ------------------------------------------------------------------------------------------------------------------  ------------------------------------------------------------------------------------------------------------------ GFR: Estimated Creatinine Clearance: 109.5 mL/min (by C-G formula based on SCr of 0.61 mg/dL). Liver Function Tests: Recent Labs  Lab 10/16/21 1637 10/17/21 1035  AST 95* 44*  ALT 77* 52*  ALKPHOS 73 73  BILITOT 0.4 0.5  PROT 7.6 6.2*  ALBUMIN 2.4* 2.1*   No results for input(s): LIPASE, AMYLASE in the last 168 hours. No results for input(s): AMMONIA in the last 168 hours. Coagulation Profile: Recent Labs  Lab 10/16/21 1651  INR 1.1   Cardiac Enzymes: No results for input(s): CKTOTAL, CKMB, CKMBINDEX, TROPONINI in the last 168 hours. BNP (last 3 results) No results for input(s): PROBNP in the last 8760 hours. HbA1C: No results for input(s): HGBA1C in the last 72 hours. CBG: No results for input(s): GLUCAP in the last 168 hours. Lipid Profile: No results for input(s): CHOL, HDL, LDLCALC, TRIG, CHOLHDL, LDLDIRECT in  the last 72 hours. Thyroid Function Tests: No results for input(s): TSH, T4TOTAL, FREET4, T3FREE, THYROIDAB in the last 72 hours. Anemia Panel: No results for input(s): VITAMINB12, FOLATE, FERRITIN, TIBC, IRON, RETICCTPCT in the last 72 hours.  --------------------------------------------------------------------------------------------------------------- Urine analysis: No results found for: COLORURINE, APPEARANCEUR, LABSPEC, PHURINE, GLUCOSEU, HGBUR, BILIRUBINUR, KETONESUR, PROTEINUR, UROBILINOGEN, NITRITE, LEUKOCYTESUR    Imaging Results:   CT scan pending     Assessment & Plan:    Principal Problem:   SIRS (systemic inflammatory response syndrome) ([place]) Active Problems:   Hyponatremia   Lobar pneumonia ([place])   Tobacco use disorder   Underweight   Malnutrition of moderate degree    Lobar pneumonia  -continued fever -Right upper lobe pneumonia -Radiology read-concern for TB: Remote risk factor-incarcerated 20 years ago -Acid-fast stain and culture x3 pending, QuantiFERON gold negative as recommended by ID -Continue broad-spectrum antibiotics cefepime -Expectorated sputum: unrevealing -blood cultures negative thus far -legionella/strep pna pending -? Lung abscess-- check CT scan of lungs- CT Scan shows ? Necrotizing PNA -briefly discussed with Pulm: treat for 4-6 weeks of augmentin at discharge and have him follow up with a repeat CT Chest. sputum cultures show normal flora. would get a couple more AFBs (total 3, 8 hours apart) to feel confident excluding TB   Tobacco use disorder -Counseled on the importance of cessation -Nicotine patch ordered  Hyponatremia -Sodium down to 122 upon admission -labs pending- -last check was 129  Nutrition Status: Nutrition Problem: Moderate Malnutrition Etiology: acute illness Signs/Symptoms: per patient/family report, energy intake < or equal to 75% for > or equal to 1 month, moderate muscle depletion, mild fat depletion,  moderate fat depletion, mild muscle depletion Interventions: Ensure Enlive (each supplement provides 350kcal and 20 grams of protein), Education        [employer]

## 2021-10-23 LAB — LEGIONELLA PNEUMOPHILA SEROGP 1 UR AG: L. pneumophila Serogp 1 Ur Ag: NEGATIVE

## 2021-10-23 LAB — CBC WITH DIFFERENTIAL/PLATELET
Abs Immature Granulocytes: 0.11 10*3/uL — ABNORMAL HIGH (ref 0.00–0.07)
Basophils Absolute: 0.1 10*3/uL (ref 0.0–0.1)
Basophils Relative: 1 %
Eosinophils Absolute: 0.1 10*3/uL (ref 0.0–0.5)
Eosinophils Relative: 0 %
HCT: 26.6 % — ABNORMAL LOW (ref 39.0–52.0)
Hemoglobin: 8.9 g/dL — ABNORMAL LOW (ref 13.0–17.0)
Immature Granulocytes: 1 %
Lymphocytes Relative: 14 %
Lymphs Abs: 1.9 10*3/uL (ref 0.7–4.0)
MCH: 29.3 pg (ref 26.0–34.0)
MCHC: 33.5 g/dL (ref 30.0–36.0)
MCV: 87.5 fL (ref 80.0–100.0)
Monocytes Absolute: 1.6 10*3/uL — ABNORMAL HIGH (ref 0.1–1.0)
Monocytes Relative: 12 %
Neutro Abs: 9.8 10*3/uL — ABNORMAL HIGH (ref 1.7–7.7)
Neutrophils Relative %: 72 %
Platelets: 813 10*3/uL — ABNORMAL HIGH (ref 150–400)
RBC: 3.04 MIL/uL — ABNORMAL LOW (ref 4.22–5.81)
RDW: 13.3 % (ref 11.5–15.5)
WBC: 13.5 10*3/uL — ABNORMAL HIGH (ref 4.0–10.5)
nRBC: 0 % (ref 0.0–0.2)

## 2021-10-23 LAB — BASIC METABOLIC PANEL
Anion gap: 7 (ref 5–15)
BUN: <5 mg/dL — ABNORMAL LOW (ref 6–20)
CO2: 28 mmol/L (ref 22–32)
Calcium: 8.2 mg/dL — ABNORMAL LOW (ref 8.9–10.3)
Chloride: 95 mmol/L — ABNORMAL LOW (ref 98–111)
Creatinine, Ser: 0.65 mg/dL (ref 0.61–1.24)
GFR, Estimated: >60 mL/min (ref >60)
Glucose, Bld: 97 mg/dL (ref 70–99)
Potassium: 3.9 mmol/L (ref 3.5–5.1)
Sodium: 130 mmol/L — ABNORMAL LOW (ref 135–145)

## 2021-10-23 MED ORDER — ALBUTEROL SULFATE (2.5 MG/3ML) 0.083% IN NEBU
2.5000 mg | INHALATION_SOLUTION | Freq: Two times a day (BID) | RESPIRATORY_TRACT | Status: DC
  Administered 2021-10-23 – 2021-10-24 (×3): 2.5 mg via RESPIRATORY_TRACT
  Filled 2021-10-23 (×5): qty 3

## 2021-10-23 NOTE — Progress Notes (Signed)
RT provided patient with flutter valve and incentive spirometry and ordered scheduled neb treatments.

## 2021-10-23 NOTE — Progress Notes (Signed)
Nutrition Follow-up  DOCUMENTATION CODES:   Non-severe (moderate) malnutrition in context of acute illness/injury  INTERVENTION:   Snacks TID  Ensure Enlive po TID, each supplement provides 350 kcal and 20 grams of protein  Encourage PO intake   NUTRITION DIAGNOSIS:   Moderate Malnutrition related to acute illness as evidenced by per patient/family report, energy intake < or equal to 75% for > or equal to 1 month, moderate muscle depletion, mild fat depletion, moderate fat depletion, mild muscle depletion. Ongoing.   GOAL:   Patient will meet greater than or equal to 90% of their needs Progressing.   MONITOR:   PO intake, Supplement acceptance, Labs, Weight trends  REASON FOR ASSESSMENT:   Malnutrition Screening Tool    ASSESSMENT:   Patient is a 41 yo male with hx of  collapsed lung. Presents from home with cough. Recent diagnoses of costochondritis. Tobacco smoker.  Pt being ruled out for TB, MD notes possible necrotizing PNA. Awaiting cultures.  Meal Completion: 10-100%   Per pt he lost a lot of weight PTA, his usual weight was 155 lb and on admission was down to 135 lb. He reports weight loss due to very poor appetite while he was sick. He reports that his appetite is improved, eating 100% of his meals. He drinks his ensure when available. He is agreeable to snacks.    Medications reviewed  Labs reviewed: Na 130    Diet Order:   Diet Order             Diet regular Room service appropriate? Yes; Fluid consistency: Thin  Diet effective now                   EDUCATION NEEDS:   Education needs have been addressed  Skin:  Skin Assessment: Reviewed RN Assessment  Last BM:  1/23  Height:   Ht Readings from Last 1 Encounters:  10/16/21 6\' 4"  (1.93 m)    Weight:   Wt Readings from Last 1 Encounters:  10/16/21 63.7 kg    BMI:  Body mass index is 17.09 kg/m.  Estimated Nutritional Needs:   Kcal:  2200-2300  Protein:  90-95 gr  Fluid:   >2 liters daily  Salathiel Ferrara P., RD, LDN, CNSC See AMi[facility] for contact information

## 2021-10-23 NOTE — Progress Notes (Signed)
HPI:    Andrew Mccarty  is a 41 y.o. male, with history of collapsed lung as a teenager and tobacco use disorder presents the ED with a chief complaint of dyspnea and cough.  He reports approximately 2 weeks into the course he started having chest pain and presented into the ED.  He was diagnosed with costochondritis which improved with naproxen.  The cough continued.  Over the last week he has had hemoptysis, copious amounts of purulent mucus, he has felt feverish, and has had no appetite.  Patient reports that he is lost 10 pounds in the last week.  He thinks he is lost 20 pounds over the total month.   - Patient does have a history of pneumothorax but reports that he has never been diagnosed with alpha antitrypsin 1 deficiency or other underlying causes of pneumothorax.   -continues with intermittent fever and copious amounts of purulent fold smelling sputum production   Subjective Had episode of lung pain this AM with coughing but overall feels better   Physical Exam:   Vitals Vitals:   10/22/21 1500 10/23/21 0124 10/23/21 0534 10/23/21 0724  BP: 116/78  126/84   Pulse: 82 76 94   Resp: 18 20 20    Temp: 98.9 F (37.2 C)  99.5 F (37.5 C)   TempSrc: Oral  Oral   SpO2: 100% 100% 99% 99%  Weight:      Height:          General: Appearance:    Thin male in no acute distress     Lungs:     Not on O2, diminished BS  Heart:    Normal heart rate.   MS:   All extremities are intact.    Neurologic:   Awake, alert, oriented x 3        Data Review:    CBC Recent Labs  Lab 10/19/21 0611 10/20/21 0530 10/21/21 0515 10/22/21 0551 10/23/21 0448  WBC 14.8* 13.5* 12.3* 10.8* 13.5*  HGB 9.4* 9.6* 8.8* 8.7* 8.9*  HCT 27.6* 27.6* 26.5* 26.6* 26.6*  PLT 625* 671* 685* 753* 813*  MCV 86.0 85.4 84.7 85.5 87.5  MCH 29.3 29.7 28.1 28.0 29.3  MCHC 34.1 34.8 33.2 32.7 33.5  RDW 13.2 13.2 13.1 13.1 13.3   LYMPHSABS 1.4 1.4 1.3 1.5 1.9  MONOABS 1.2* 1.3* 1.4* 1.4* 1.6*  EOSABS 0.1 0.1 0.1 0.1 0.1  BASOSABS 0.1 0.1 0.1 0.1 0.1   ------------------------------------------------------------------------------------------------------------------    Chemistries  Recent Labs  Lab 10/16/21 1637 10/17/21 1035 10/18/21 0547 10/19/21 0611 10/23/21 0448  NA 122* 129* 129* 129* 130*  K 3.5 3.6 3.7 3.7 3.9  CL 87* 94* 95* 97* 95*  CO2 24 25 26 25 28   GLUCOSE 135* 137* 94 98 97  BUN 10 8 7 7  <5*  CREATININE 0.74 0.82 0.70 0.61 0.65  CALCIUM 8.1* 8.0* 8.0* 7.7* 8.2*  MG  --  2.0 1.8  --   --   AST 95* 44*  --   --   --  ALT 77* 52*  --   --   --   ALKPHOS 73 73  --   --   --   BILITOT 0.4 0.5  --   --   --    ------------------------------------------------------------------------------------------------------------------  ------------------------------------------------------------------------------------------------------------------ GFR: Estimated Creatinine Clearance: 109.5 mL/min (by C-G formula based on SCr of 0.65 mg/dL). Liver Function Tests: Recent Labs  Lab 10/16/21 1637 10/17/21 1035  AST 95* 44*  ALT 77* 52*  ALKPHOS 73 73  BILITOT 0.4 0.5  PROT 7.6 6.2*  ALBUMIN 2.4* 2.1*   No results for input(s): LIPASE, AMYLASE in the last 168 hours. No results for input(s): AMMONIA in the last 168 hours. Coagulation Profile: Recent Labs  Lab 10/16/21 1651  INR 1.1   Cardiac Enzymes: No results for input(s): CKTOTAL, CKMB, CKMBINDEX, TROPONINI in the last 168 hours. BNP (last 3 results) No results for input(s): PROBNP in the last 8760 hours. HbA1C: No results for input(s): HGBA1C in the last 72 hours. CBG: No results for input(s): GLUCAP in the last 168 hours. Lipid Profile: No results for input(s): CHOL, HDL, LDLCALC, TRIG, CHOLHDL, LDLDIRECT in the last 72 hours. Thyroid Function Tests: No results for input(s): TSH, T4TOTAL, FREET4, T3FREE, THYROIDAB in the last  72 hours. Anemia Panel: No results for input(s): VITAMINB12, FOLATE, FERRITIN, TIBC, IRON, RETICCTPCT in the last 72 hours.  --------------------------------------------------------------------------------------------------------------- Urine analysis:    Component Value Date/Time   COLORURINE YELLOW 10/22/2021 0958   APPEARANCEUR CLEAR 10/22/2021 0958   LABSPEC 1.020 10/22/2021 0958   PHURINE 6.0 10/22/2021 0958   GLUCOSEU NEGATIVE 10/22/2021 0958   HGBUR NEGATIVE 10/22/2021 0958   BILIRUBINUR NEGATIVE 10/22/2021 0958   KETONESUR NEGATIVE 10/22/2021 0958   PROTEINUR NEGATIVE 10/22/2021 0958   NITRITE NEGATIVE 10/22/2021 0958   LEUKOCYTESUR NEGATIVE 10/22/2021 0958      Imaging Results:   CT scan done- see results below     Assessment & Plan:    Principal Problem:   SIRS (systemic inflammatory response syndrome) ([facility]) Active Problems:   Hyponatremia   Lobar pneumonia ([facility])   Tobacco use disorder   Underweight   Malnutrition of moderate degree    Lobar pneumonia -continued fever as he was on cefepime and not covering anerobes-- have changed to augmentin and he feels better -Radiology read-concern for TB: Remote risk factor-incarcerated 20 years ago -Acid-fast stain and culture x3 pending, QuantiFERON gold negative  -Expectorated sputum: unrevealing -blood cultures negative thus far -legionella/strep pna negative -CT scan of lungs- CT Scan shows ? Necrotizing PNA -briefly discussed with Pulm: treat for 4-6 weeks of augmentin at discharge and have him follow up with a repeat CT Chest.  -spoke with ID, does not need to be in the hospital awaiting AFB results, health department will contact if positive   Tobacco use disorder -Counseled on the importance of cessation -Nicotine patch ordered  Hyponatremia -Sodium down to 122 upon admission -improved to 130  Nutrition Status: Nutrition Problem: Moderate Malnutrition Etiology: acute illness Signs/Symptoms: per  patient/family report, energy intake < or equal to 75% for > or equal to 1 month, moderate muscle depletion, mild fat depletion, moderate fat depletion, mild muscle depletion Interventions: Ensure Enlive (each supplement provides 350kcal and 20 grams of protein), Education     TOC consult for PCP -will also need to follow up with dental (in [place] there is a sliding scale dentist group: Dr. Tamsen Meek   Joseph Art

## 2021-10-23 NOTE — Progress Notes (Signed)
SATURATION QUALIFICATIONS: (This note is used to comply with regulatory documentation for home oxygen)  Patient Saturations on Room Air at Rest = 97%  Patient Saturations on Room Air while Ambulating = 95%  Patient Saturations on 0 Liters of oxygen while Ambulating = 95-97%  Please briefly explain why patient needs home oxygen:

## 2021-10-23 NOTE — Progress Notes (Signed)
Patient called Clinical research associate to room to assess sputum with noted blood and to report new onset pain to right chest lung area was feeling pain more at the top of chest now more so at the bottom stated he felt like something popped on the inside of his chest when he was coughing. Pain medication given and RT called for breathing treatment.

## 2021-10-24 LAB — BASIC METABOLIC PANEL
Anion gap: 7 (ref 5–15)
BUN: <5 mg/dL — ABNORMAL LOW (ref 6–20)
CO2: 29 mmol/L (ref 22–32)
Calcium: 8.4 mg/dL — ABNORMAL LOW (ref 8.9–10.3)
Chloride: 96 mmol/L — ABNORMAL LOW (ref 98–111)
Creatinine, Ser: 0.6 mg/dL — ABNORMAL LOW (ref 0.61–1.24)
GFR, Estimated: >60 mL/min (ref >60)
Glucose, Bld: 93 mg/dL (ref 70–99)
Potassium: 4.3 mmol/L (ref 3.5–5.1)
Sodium: 132 mmol/L — ABNORMAL LOW (ref 135–145)

## 2021-10-24 LAB — CBC
HCT: 26.4 % — ABNORMAL LOW (ref 39.0–52.0)
Hemoglobin: 8.9 g/dL — ABNORMAL LOW (ref 13.0–17.0)
MCH: 29.4 pg (ref 26.0–34.0)
MCHC: 33.7 g/dL (ref 30.0–36.0)
MCV: 87.1 fL (ref 80.0–100.0)
Platelets: 827 10*3/uL — ABNORMAL HIGH (ref 150–400)
RBC: 3.03 MIL/uL — ABNORMAL LOW (ref 4.22–5.81)
RDW: 13.3 % (ref 11.5–15.5)
WBC: 9.4 10*3/uL (ref 4.0–10.5)
nRBC: 0 % (ref 0.0–0.2)

## 2021-10-24 NOTE — Progress Notes (Signed)
HPI:    Andrew Mccarty  is a 41 y.o. male, with history of collapsed lung as a teenager and tobacco use disorder presents the ED with a chief complaint of dyspnea and cough.  He reports approximately 2 weeks into the course he started having chest pain and presented into the ED.  He was diagnosed with costochondritis which improved with naproxen.  The cough continued.  Over the last week he has had hemoptysis, copious amounts of purulent mucus, he has felt feverish, and has had no appetite.  Patient reports that he is lost 10 pounds in the last week.  He thinks he is lost 20 pounds over the total month.   - Patient does have a history of pneumothorax but reports that he has never been diagnosed with alpha antitrypsin 1 deficiency or other underlying causes of pneumothorax.   -continues with intermittent fever and copious amounts of purulent fold smelling sputum production- CT scan done that shows necrotizing PNA with ? Cavitary lesions concerning for TB.      Assessment & Plan:    Principal Problem:   SIRS (systemic inflammatory response syndrome) ([facility]) Active Problems:   Hyponatremia   Lobar pneumonia ([facility])   Tobacco use disorder   Underweight   Malnutrition of moderate degree    Lobar pneumonia -continued fever as he was on cefepime and not covering anerobes-- have changed to augmentin and he feels better and WBC count normalized -Radiology read-concern for TB: Remote risk factor-incarcerated 20 years ago -Acid-fast stain and culture x3 pending, QuantiFERON gold negative  -Expectorated sputum: unrevealing -blood cultures negative -legionella/strep pna negative -CT scan of lungs- CT Scan shows ? Necrotizing PNA -briefly discussed with on-call Pulm: treat for 4-6 weeks of augmentin at discharge and have him follow up with a repeat CT Chest.  -spoke with ID (comer) does not need to be in the hospital  awaiting AFB results, health department will contact if positive -? Home in AM?  Tobacco use disorder -Counseled on the importance of cessation -Nicotine patch ordered  Hyponatremia -Sodium down to 122 upon admission -improved to 132 -labs in AM  Nutrition Status: Nutrition Problem: Moderate Malnutrition Etiology: acute illness Signs/Symptoms: per patient/family report, energy intake < or equal to 75% for > or equal to 1 month, moderate muscle depletion, mild fat depletion, moderate fat depletion, mild muscle depletion Interventions: Ensure Enlive (each supplement provides 350kcal and 20 grams of protein), Education     TOC consult for PCP/follow up -LCSW discussed referral to [facility] for PCP.  Pt also needs dental care. LCSW called Dr. Pernell Dupre and Dr. Yetta Flock office and they do not offer sliding scale. Per [facility] they can refer for dental work as well after pt sees PCP. Diane also aware pt will need chest CT.     Subjective Still coughing up lots of sputum.  Not as foul smelling    Physical Exam:   Vitals Vitals:   10/23/21 2026 10/23/21 2220 10/24/21 0455 10/24/21 0725  BP:  126/74 120/79   Pulse:  92  87   Resp:  19 18   Temp:  99.1 F (37.3 C) 98.3 F (36.8 C)   TempSrc:  Oral    SpO2: 99% 98% 99% 99%  Weight:      Height:          General: Appearance:    Thin male in no acute distress     Lungs:     Not on O2, right side with rhonchus breath sounds, respirations unlabored  Heart:    Normal heart rate.    MS:   All extremities are intact.    Neurologic:   Awake, alert, oriented x 3. No apparent focal neurological           defect.           Data Review:    CBC Recent Labs  Lab 10/19/21 0611 10/20/21 0530 10/21/21 0515 10/22/21 0551 10/23/21 0448 10/24/21 0544  WBC 14.8* 13.5* 12.3* 10.8* 13.5* 9.4  HGB 9.4* 9.6* 8.8* 8.7* 8.9* 8.9*  HCT 27.6* 27.6* 26.5* 26.6* 26.6* 26.4*  PLT 625* 671* 685* 753* 813* 827*  MCV 86.0 85.4 84.7 85.5  87.5 87.1  MCH 29.3 29.7 28.1 28.0 29.3 29.4  MCHC 34.1 34.8 33.2 32.7 33.5 33.7  RDW 13.2 13.2 13.1 13.1 13.3 13.3  LYMPHSABS 1.4 1.4 1.3 1.5 1.9  --   MONOABS 1.2* 1.3* 1.4* 1.4* 1.6*  --   EOSABS 0.1 0.1 0.1 0.1 0.1  --   BASOSABS 0.1 0.1 0.1 0.1 0.1  --    ------------------------------------------------------------------------------------------------------------------    Chemistries  Recent Labs  Lab 10/17/21 1035 10/18/21 0547 10/19/21 0611 10/23/21 0448 10/24/21 0544  NA 129* 129* 129* 130* 132*  K 3.6 3.7 3.7 3.9 4.3  CL 94* 95* 97* 95* 96*  CO2 25 26 25 28 29   GLUCOSE 137* 94 98 97 93  BUN 8 7 7  <5* <5*  CREATININE 0.82 0.70 0.61 0.65 0.60*  CALCIUM 8.0* 8.0* 7.7* 8.2* 8.4*  MG 2.0 1.8  --   --   --   AST 44*  --   --   --   --   ALT 52*  --   --   --   --   ALKPHOS 73  --   --   --   --   BILITOT 0.5  --   --   --   --    ------------------------------------------------------------------------------------------------------------------  ------------------------------------------------------------------------------------------------------------------ GFR: Estimated Creatinine Clearance: 109.5 mL/min (A) (by C-G formula based on SCr of 0.6 mg/dL (L)). Liver Function Tests: Recent Labs  Lab 10/17/21 1035  AST 44*  ALT 52*  ALKPHOS 73  BILITOT 0.5  PROT 6.2*  ALBUMIN 2.1*   No results for input(s): LIPASE, AMYLASE in the last 168 hours. No results for input(s): AMMONIA in the last 168 hours. Coagulation Profile: No results for input(s): INR, PROTIME in the last 168 hours.  Cardiac Enzymes: No results for input(s): CKTOTAL, CKMB, CKMBINDEX, TROPONINI in the last 168 hours. BNP (last 3 results) No results for input(s): PROBNP in the last 8760 hours. HbA1C: No results for input(s): HGBA1C in the last 72 hours. CBG: No results for input(s): GLUCAP in the last 168 hours. Lipid Profile: No results for input(s): CHOL, HDL, LDLCALC, TRIG, CHOLHDL,  LDLDIRECT in the last 72 hours. Thyroid Function Tests: No results for input(s): TSH, T4TOTAL, FREET4, T3FREE, THYROIDAB in the last 72 hours. Anemia Panel: No results for input(s): VITAMINB12, FOLATE, FERRITIN, TIBC, IRON, RETICCTPCT in the last 72  hours.  --------------------------------------------------------------------------------------------------------------- Urine analysis:    Component Value Date/Time   COLORURINE YELLOW 10/22/2021 0958   APPEARANCEUR CLEAR 10/22/2021 0958   LABSPEC 1.020 10/22/2021 0958   PHURINE 6.0 10/22/2021 0958   GLUCOSEU NEGATIVE 10/22/2021 0958   HGBUR NEGATIVE 10/22/2021 0958   BILIRUBINUR NEGATIVE 10/22/2021 0958   KETONESUR NEGATIVE 10/22/2021 0958   PROTEINUR NEGATIVE 10/22/2021 0958   NITRITE NEGATIVE 10/22/2021 0958   LEUKOCYTESUR NEGATIVE 10/22/2021 0958      Imaging Results:   CT scan done- results reviewed and shown to patient    Andrew Mccarty

## 2021-10-24 NOTE — TOC Progression Note (Signed)
Transition of Care [facility]) - Progression Note    Patient Details  Name: CANTON YEARBY MRN: [id number] Date of Birth: 1981-04-24  [facility] CM/SW Contact  Karn Cassis, [place] Phone Number: 10/24/2021, 8:54 AM  Clinical Narrative:  LCSW discussed referral to [facility] for PCP. Pt agreeable. Referral made to Diane. Pt also needs dental care. LCSW called Dr. Pernell Dupre and Dr. Yetta Flock office and they do not offer sliding scale. Per [facility] they can refer for dental work as well after pt sees PCP. Diane also aware pt will need chest CT.        Barriers to Discharge: Continued Medical Work up  Expected Discharge Plan and Services                                                 Social Determinants of Health (SDOH) Interventions    Readmission Risk Interventions No flowsheet data found.

## 2021-10-25 DIAGNOSIS — J159 Unspecified bacterial pneumonia: Secondary | ICD-10-CM

## 2021-10-25 LAB — BASIC METABOLIC PANEL
Anion gap: 6 (ref 5–15)
BUN: 6 mg/dL (ref 6–20)
CO2: 29 mmol/L (ref 22–32)
Calcium: 8.2 mg/dL — ABNORMAL LOW (ref 8.9–10.3)
Chloride: 98 mmol/L (ref 98–111)
Creatinine, Ser: 0.62 mg/dL (ref 0.61–1.24)
GFR, Estimated: >60 mL/min (ref >60)
Glucose, Bld: 108 mg/dL — ABNORMAL HIGH (ref 70–99)
Potassium: 4.3 mmol/L (ref 3.5–5.1)
Sodium: 133 mmol/L — ABNORMAL LOW (ref 135–145)

## 2021-10-25 LAB — ACID FAST SMEAR (AFB, MYCOBACTERIA): Acid Fast Smear: NEGATIVE

## 2021-10-25 LAB — CULTURE, RESPIRATORY W GRAM STAIN: Culture: NORMAL

## 2021-10-25 MED ORDER — AMOXICILLIN-POT CLAVULANATE 875-125 MG PO TABS: 1.0000 | ORAL_TABLET | Freq: Two times a day (BID) | ORAL | 0 refills | Status: AC

## 2021-10-25 MED ORDER — ACETAMINOPHEN 325 MG PO TABS: 650.0000 mg | ORAL_TABLET | Freq: Four times a day (QID) | ORAL | Status: DC | PRN

## 2021-10-25 MED ORDER — NICOTINE 14 MG/24HR TD PT24: 14.0000 mg | MEDICATED_PATCH | Freq: Every day | TRANSDERMAL | 0 refills | Status: AC

## 2021-10-25 NOTE — Discharge Summary (Signed)
Physician Discharge Summary   Patient: Andrew Mccarty MRN: [id number] DOB: 08/25/1981  Admit date:     10/16/2021  Discharge date: 10/25/21  Discharge Physician: Almon Hercules   PCP: Patient, No Pcp Per (Inactive)   Recommendations at discharge:  Outpatient follow-up with PCP in 1 to 2 weeks Ambulatory referral to pulmonology ordered.  Discharge Diagnoses: Sepsis due to lobar pneumonia-patient presents with shortness of breath, productive cough with copious purulent phlegm and unintentional weight loss.  CT chest concerning for multilobar pneumonia in the right lung with necrotizing component in the RUL including large cavitary area in the apex of RUL.  QuantiFERON gold negative.  AFB negative x2.  Sputum culture unrevealing.  Blood cultures negative.  ID and pulmonary consulted.  Pulmonology recommended 4 to 6 weeks of p.o. Augmentin and outpatient follow-up for repeat CT -Discharge on p.o. Augmentin for 1 months. -Ambulatory referral to pulmonology ordered. -Encouraged to establish care with PCP -Encouraged to quit smoking cigarettes.  Tobacco use disorder -Encouraged to quit smoking cigarettes.  Mild hyponatremia: Improved -Recheck at follow-up.  Unintentional weight loss/underwent: Likely due to #1. -Treatment as above -Encouraged to get protein shakes such as Ensure or boost     Pain control - [employer] Controlled Substance Reporting System database was reviewed. and patient was instructed, not to drive, operate heavy machinery, perform activities at heights, swimming or participation in water activities or provide baby-sitting services while on Pain, Sleep and Anxiety Medications; until their outpatient Physician has advised to do so again. Also recommended to not to take more than prescribed Pain, Sleep and Anxiety Medications.   Consultants: Pulmonology, infectious disease  Procedures performed: None Disposition: Home Diet recommendation:  Discharge Diet Orders  (From admission, onward)     Start     Ordered   10/25/21 0000  Diet general        10/25/21 0855           Regular diet  DISCHARGE MEDICATION: Allergies as of 10/25/2021       Reactions   Lactose Intolerance (gi) Diarrhea   Morphine And Related         Medication List     STOP taking these medications    ibuprofen 800 MG tablet Commonly known as: ADVIL   naproxen sodium 220 MG tablet Commonly known as: ALEVE       TAKE these medications    acetaminophen 325 MG tablet Commonly known as: TYLENOL Take 2 tablets (650 mg total) by mouth every 6 (six) hours as needed for mild pain (or Fever >/= 101).   amoxicillin-clavulanate 875-125 MG tablet Commonly known as: AUGMENTIN Take 1 tablet by mouth every 12 (twelve) hours.   nicotine 14 mg/24hr patch Commonly known as: NICODERM CQ - dosed in mg/24 hours Place 1 patch (14 mg total) onto the skin daily.        Follow-up Information     [facility] Follow up.   Why: To establish primary care physician. Contact information: [phone]                Discharge Exam: Filed Weights   10/16/21 1552 10/16/21 2348  Weight: 61.6 kg 63.7 kg   GENERAL: No apparent distress.  Nontoxic. HEENT: MMM.  Vision and hearing grossly intact.  NECK: Supple.  No apparent JVD.  RESP:  No IWOB.  Fair aeration bilaterally. CVS:  RRR. Heart sounds normal.  ABD/GI/GU: BS+. Abd soft, NTND.  MSK/EXT:  Moves extremities. No apparent deformity. No edema.  SKIN: no apparent skin lesion or wound NEURO: Awake and alert. Oriented appropriately.  No apparent focal neuro deficit. PSYCH: Calm. Normal affect.   Condition at discharge: stable  The results of significant diagnostics from this hospitalization (including imaging, microbiology, ancillary and laboratory) are listed below for reference.   Imaging Studies: DG Chest 2 View  Result Date: 10/16/2021 CLINICAL DATA:  Chest pain productive cough EXAM: CHEST - 2 VIEW  COMPARISON:  09/30/2021 FINDINGS: Consolidation and irregular lucencies now visible in the right upper lobe. No pleural effusion. Possible right paratracheal adenopathy. Normal cardiac size. No pneumothorax IMPRESSION: Interval consolidation and irregular lucencies in the right upper lobe, concerning for pulmonary infection/pneumonia, appearance raises concern for TB. There is possible right paratracheal adenopathy. Critical Value/emergent results were called by telephone at the time of interpretation on 10/16/2021 at 5:07 pm to provider PA Cammy Copa , who verbally acknowledged these results. Electronically Signed   By: Jasmine Pang M.D.   On: 10/16/2021 17:08   CT CHEST W CONTRAST  Result Date: 10/22/2021 CLINICAL DATA:  41 year old male with history of pneumonia. EXAM: CT CHEST WITH CONTRAST TECHNIQUE: Multidetector CT imaging of the chest was performed during intravenous contrast administration. RADIATION DOSE REDUCTION: This exam was performed according to the departmental dose-optimization program which includes automated exposure control, adjustment of the mA and/or kV according to patient size and/or use of iterative reconstruction technique. CONTRAST:  26mL OMNIPAQUE IOHEXOL 300 MG/ML  SOLN COMPARISON:  No priors. FINDINGS: Cardiovascular: Heart size is normal. There is no significant pericardial fluid, thickening or pericardial calcification. Aortic atherosclerosis. No definite coronary artery calcifications. Mediastinum/Nodes: No pathologically enlarged mediastinal or hilar lymph nodes. Esophagus is unremarkable in appearance. No axillary lymphadenopathy. Lungs/Pleura: Patchy peribronchovascular airspace consolidation noted throughout the right lung. In the right upper lobe there is more dense airspace consolidation which is confluent in some regions and cavitary in others, with extensive thick-walled cavitation extending into the apex of the right hemithorax. A few areas of very mild peribronchovascular  ground-glass attenuation are also noted in the left lower lobe, likely infectious/inflammatory in etiology. No pleural effusions. Moderate paraseptal and mild centrilobular emphysema. Upper Abdomen: Aortic atherosclerosis. Musculoskeletal: There are no aggressive appearing lytic or blastic lesions noted in the visualized portions of the skeleton. IMPRESSION: 1. Multilobar pneumonia in the right lung with necrotizing component in the right upper lobe including a large cavitary area in the apex of the right upper lobe. This could simply represent necrotizing bacterial pneumonia, however, respiratory isolation and further clinical evaluation is recommended to exclude the possibility of cavitary tuberculosis is recommended. 2. Aortic atherosclerosis. 3. Emphysema. These results will be called to the ordering clinician or representative by the Radiologist Assistant, and communication documented in the PACS or [employer]. Aortic Atherosclerosis (ICD10-I70.0) and Emphysema (ICD10-J43.9). Electronically Signed   By: Trudie Reed M.D.   On: 10/22/2021 12:52   DG Chest Port 1 View  Result Date: 09/30/2021 CLINICAL DATA:  Right side chest pain, pt has a history of Pneumothorax of left side about 20 yrs ago and said it feels the same. EXAM: PORTABLE CHEST 1 VIEW COMPARISON:  11/11/2013. FINDINGS: Heart, mediastinum hila are unremarkable. Lungs are hyperexpanded. There stable post surgical changes with pulmonary anastomosis staples near the left apex. Changes of emphysema/blebs are noted in the apices. Lungs are clear. No pleural effusion. No pneumothorax. Skeletal structures are grossly intact. IMPRESSION: 1. No acute cardiopulmonary disease.  No evidence of a pneumothorax. Electronically Signed   By: Renard Hamper.D.  On: 09/30/2021 11:01    Microbiology: Results for orders placed or performed during the hospital encounter of 10/16/21  Blood culture (routine x 2)     Status: None   Collection Time:  10/16/21  4:44 PM   Specimen: Left Antecubital; Blood  Result Value Ref Range Status   Specimen Description LEFT ANTECUBITAL  Final   Special Requests   Final    BOTTLES DRAWN AEROBIC AND ANAEROBIC Blood Culture adequate volume   Culture   Final    NO GROWTH 5 DAYS Performed at [facility], [address] [place], [place] [address]    Report Status 10/21/2021 FINAL  Final  Blood culture (routine x 2)     Status: None   Collection Time: 10/16/21  4:44 PM   Specimen: Left Antecubital; Blood  Result Value Ref Range Status   Specimen Description LEFT ANTECUBITAL  Final   Special Requests   Final    BOTTLES DRAWN AEROBIC AND ANAEROBIC Blood Culture adequate volume   Culture   Final    NO GROWTH 5 DAYS Performed at [facility], [address] [place], [place] [address]    Report Status 10/21/2021 FINAL  Final  Resp Panel by RT-PCR (Flu A&B, Covid) Nasopharyngeal Swab     Status: None   Collection Time: 10/16/21  5:50 PM   Specimen: Nasopharyngeal Swab; Nasopharyngeal(NP) swabs in vial transport medium  Result Value Ref Range Status   SARS Coronavirus 2 by RT PCR NEGATIVE NEGATIVE Final    Comment: (NOTE) SARS-CoV-2 target nucleic acids are NOT DETECTED.  The SARS-CoV-2 RNA is generally detectable in upper respiratory specimens during the acute phase of infection. The lowest concentration of SARS-CoV-2 viral copies this assay can detect is 138 copies/mL. A negative result does not preclude SARS-Cov-2 infection and should not be used as the sole basis for treatment or other patient management decisions. A negative result may occur with  improper specimen collection/handling, submission of specimen other than nasopharyngeal swab, presence of viral mutation(s) within the areas targeted by this assay, and inadequate number of viral copies(<138 copies/mL). A negative result must be combined with clinical observations, patient history, and epidemiological information. The expected  result is Negative.  Fact Sheet for Patients:  BloggerCourse.comhttps://www.fda.gov/media/152166/download  Fact Sheet for Healthcare Providers:  SeriousBroker.ithttps://www.fda.gov/media/152162/download  This test is no t yet approved or cleared by the [place] FDA and  has been authorized for detection and/or diagnosis of SARS-CoV-2 by FDA under an Emergency Use Authorization (EUA). This EUA will remain  in effect (meaning this test can be used) for the duration of the COVID-19 declaration under Section 564(b)(1) of the Act, 21 U.S.C.section 360bbb-3(b)(1), unless the authorization is terminated  or revoked sooner.       Influenza A by PCR NEGATIVE NEGATIVE Final   Influenza B by PCR NEGATIVE NEGATIVE Final    Comment: (NOTE) The Xpert Xpress SARS-CoV-2/FLU/RSV plus assay is intended as an aid in the diagnosis of influenza from Nasopharyngeal swab specimens and should not be used as a sole basis for treatment. Nasal washings and aspirates are unacceptable for Xpert Xpress SARS-CoV-2/FLU/RSV testing.  Fact Sheet for Patients: BloggerCourse.comhttps://www.fda.gov/media/152166/download  Fact Sheet for Healthcare Providers: SeriousBroker.ithttps://www.fda.gov/media/152162/download  This test is not yet approved or cleared by the [place] FDA and has been authorized for detection and/or diagnosis of SARS-CoV-2 by FDA under an Emergency Use Authorization (EUA). This EUA will remain in effect (meaning this test can be used) for the duration of the COVID-19 declaration under Section  564(b)(1) of the Act, 21 U.S.C. section 360bbb-3(b)(1), unless the authorization is terminated or revoked.  Performed at [facility], [address], [place], [place] [address]   Acid Fast Smear (AFB)     Status: None   Collection Time: 10/17/21  4:41 AM   Specimen: Expectorated Sputum; Respiratory  Result Value Ref Range Status   AFB Specimen Processing Concentration  Final   Acid Fast Smear Negative  Final    Comment: (NOTE) Performed At: [facility] [address] [place], [place] [address] Jolene Schimke MD XB:[id number]    Source (AFB) SPU  Final    Comment: Performed at [facility], [address] [place], [place] [address]  Expectorated Sputum Assessment w Gram Stain, Rflx to Resp Cult     Status: None   Collection Time: 10/17/21  4:41 AM   Specimen: Sputum  Result Value Ref Range Status   Specimen Description SPU  Final   Special Requests NONE  Final   Sputum evaluation   Final    THIS SPECIMEN IS ACCEPTABLE FOR SPUTUM CULTURE Performed at [facility], [address], [place], [place] [address]    Report Status 10/17/2021 FINAL  Final  Culture, Respiratory w Gram Stain     Status: None   Collection Time: 10/17/21  4:41 AM   Specimen: Sputum  Result Value Ref Range Status   Specimen Description   Final    SPU Performed at [facility], [address], [place], [place] [address]    Special Requests   Final    NONE Reflexed from [id number] Performed at [facility], [address], [place], [place] [address]    Gram Stain   Final    FEW WBC PRESENT, PREDOMINANTLY MONONUCLEAR FEW GRAM POSITIVE COCCI IN PAIRS FEW GRAM VARIABLE ROD    Culture   Final    MODERATE Normal respiratory flora-no Staph aureus or Pseudomonas seen Performed at [facility], [address], [place], [place] [address]    Report Status 10/19/2021 FINAL  Final  MRSA Next Gen by PCR, Nasal     Status: None   Collection Time: 10/17/21 10:28 AM   Specimen: Nasal Mucosa; Nasal Swab  Result Value Ref Range Status   MRSA by PCR Next Gen NOT DETECTED NOT DETECTED Final    Comment: (NOTE) The GeneXpert MRSA Assay (FDA approved for NASAL specimens only), is one component of a comprehensive MRSA colonization surveillance program. It is not intended to diagnose MRSA infection nor to guide or monitor treatment for MRSA infections. Test performance is not FDA approved in patients less than 83 years old. Performed at [facility], [address], [place], [place] [address]   Acid Fast Smear (AFB)     Status: None   Collection Time: 10/17/21  7:07 PM   Specimen: Expectorated Sputum; Respiratory  Result Value Ref Range Status   AFB Specimen Processing Concentration  Final   Acid Fast Smear Negative  Final    Comment: (NOTE) Performed At: [facility] [address] [place], [place] [address] Jolene Schimke MD OV:[id number]    Source (AFB) SPUTUM  Final    Comment: Performed at [facility], [address], [place], [place] [address]  Expectorated Sputum Assessment w Gram Stain, Rflx to Resp Cult     Status: None   Collection Time: 10/22/21 10:25 AM   Specimen: Expectorated Sputum  Result Value Ref Range Status   Specimen Description EXPECTORATED SPUTUM  Final   Special Requests  NONE  Final   Sputum evaluation   Final    THIS SPECIMEN IS ACCEPTABLE FOR SPUTUM CULTURE Performed at [facility], [address] [place], [place] [address]    Report Status 10/22/2021 FINAL  Final  Culture, Respiratory w Gram Stain     Status: None   Collection Time: 10/22/21 10:25 AM  Result Value Ref Range Status   Specimen Description   Final    EXPECTORATED SPUTUM Performed at [facility], [address] [place], [place] [address]    Special Requests   Final    NONE Reflexed from [id number] Performed at [facility], [address] [place], [place] [address]    Gram Stain   Final    FEW SQUAMOUS EPITHELIAL CELLS PRESENT MODERATE WBC SEEN RARE GRAM POSITIVE RODS FEW GRAM NEGATIVE RODS FEW GRAM POSITIVE COCCI    Culture   Final    MODERATE Normal respiratory flora-no Staph aureus or Pseudomonas seen Performed at [facility], [address] [place], [place] [address]    Report Status 10/25/2021 FINAL  Final    Labs: CBC: Recent Labs  Lab 10/19/21 0611 10/20/21 0530 10/21/21 0515 10/22/21 0551 10/23/21 0448 10/24/21 0544  WBC 14.8* 13.5* 12.3* 10.8* 13.5* 9.4  NEUTROABS 11.9* 10.4* 9.3* 7.7  9.8*  --   HGB 9.4* 9.6* 8.8* 8.7* 8.9* 8.9*  HCT 27.6* 27.6* 26.5* 26.6* 26.6* 26.4*  MCV 86.0 85.4 84.7 85.5 87.5 87.1  PLT 625* 671* 685* 753* 813* 827*   Basic Metabolic Panel: Recent Labs  Lab 10/19/21 0611 10/23/21 0448 10/24/21 0544 10/25/21 0546  NA 129* 130* 132* 133*  K 3.7 3.9 4.3 4.3  CL 97* 95* 96* 98  CO2 25 28 29 29   GLUCOSE 98 97 93 108*  BUN 7 <5* <5* 6  CREATININE 0.61 0.65 0.60* 0.62  CALCIUM 7.7* 8.2* 8.4* 8.2*   Liver Function Tests: No results for input(s): AST, ALT, ALKPHOS, BILITOT, PROT, ALBUMIN in the last 168 hours. CBG: No results for input(s): GLUCAP in the last 168 hours.  Discharge time spent: greater than 30 minutes.  Signed: Almon Herculesaye T Rylyn Ranganathan, MD [facility] 10/25/2021

## 2021-10-25 NOTE — Progress Notes (Signed)
Patient discharged home today, transported by family home. Discharge summary went over with patient, patient verbalized understanding. Belongings sent home with patient.

## 2021-10-26 LAB — ACID FAST SMEAR (AFB, MYCOBACTERIA): Acid Fast Smear: NEGATIVE

## 2021-12-01 LAB — ACID FAST CULTURE WITH REFLEXED SENSITIVITIES (MYCOBACTERIA): Acid Fast Culture: NEGATIVE

## 2021-12-07 ENCOUNTER — Institutional Professional Consult (permissible substitution): Payer: Medicaid Other | Admitting: Internal Medicine

## 2021-12-10 LAB — ACID FAST CULTURE WITH REFLEXED SENSITIVITIES (MYCOBACTERIA)
Acid Fast Culture: NEGATIVE
Acid Fast Culture: NEGATIVE

## 2022-05-13 ENCOUNTER — Other Ambulatory Visit: Payer: Self-pay

## 2022-05-13 ENCOUNTER — Emergency Department (HOSPITAL_COMMUNITY)
Admission: EM | Admit: 2022-05-13 | Discharge: 2022-05-13 | Disposition: A | Discharge status: Home / Self Care | Payer: Medicaid Other | Attending: Emergency Medicine | Admitting: Emergency Medicine

## 2022-05-13 ENCOUNTER — Encounter (HOSPITAL_COMMUNITY): Payer: Self-pay

## 2022-05-13 DIAGNOSIS — X500XXA Overexertion from strenuous movement or load, initial encounter: Secondary | ICD-10-CM | POA: Insufficient documentation

## 2022-05-13 DIAGNOSIS — M62830 Muscle spasm of back: Secondary | ICD-10-CM | POA: Insufficient documentation

## 2022-05-13 DIAGNOSIS — M62838 Other muscle spasm: Secondary | ICD-10-CM

## 2022-05-13 MED ORDER — LIDOCAINE 5 % EX PTCH
1.0000 | MEDICATED_PATCH | CUTANEOUS | Status: DC
  Administered 2022-05-13: 1 via TRANSDERMAL
  Filled 2022-05-13: qty 1

## 2022-05-13 MED ORDER — LIDOCAINE 5 % EX PTCH: 1.0000 | MEDICATED_PATCH | CUTANEOUS | 0 refills | Status: AC

## 2022-05-13 MED ORDER — METHOCARBAMOL 500 MG PO TABS: 500.0000 mg | ORAL_TABLET | Freq: Two times a day (BID) | ORAL | 0 refills | Status: AC

## 2022-05-13 MED ORDER — METHOCARBAMOL 500 MG PO TABS
500.0000 mg | ORAL_TABLET | Freq: Once | ORAL | Status: AC
  Administered 2022-05-13: 500 mg via ORAL
  Filled 2022-05-13: qty 1

## 2022-05-13 NOTE — ED Triage Notes (Signed)
Pt c/o lower back pain after helping several people get on horse today

## 2022-05-13 NOTE — ED Notes (Addendum)
Pt ambulatory to tx room however holding/guarding lumbar area.

## 2022-05-13 NOTE — ED Provider Notes (Signed)
[facility] EMERGENCY DEPARTMENT Provider Note   CSN: [id number] Arrival date & time: 05/13/22  0022     History  Chief Complaint  Patient presents with   Back Pain    Andrew Mccarty is a 41 y.o. male.  The history is provided by the patient and a significant other.  Back Pain Associated symptoms: no fever    Patient presents with back pain.  He reports he was at a family reunion and he was helping older women onto a horse.  As soon as he lifted a person, he had back pain and spasming.  No falls or trauma.  No leg weakness.  No incontinence.  No previous back trauma or surgery.  No abdominal pain.  No difficulty urinating or dysuria    Home Medications Prior to Admission medications   Medication Sig Start Date End Date Taking? Authorizing Provider  lidocaine (LIDODERM) 5 % Place 1 patch onto the skin daily. Remove & Discard patch within 12 hours or as directed by MD 05/13/22  Yes Zadie Rhine, MD  methocarbamol (ROBAXIN) 500 MG tablet Take 1 tablet (500 mg total) by mouth 2 (two) times daily. 05/13/22  Yes Zadie Rhine, MD  nicotine (NICODERM CQ - DOSED IN MG/24 HOURS) 14 mg/24hr patch Place 1 patch (14 mg total) onto the skin daily. 10/25/21   Almon Hercules, MD      Allergies    Lactose intolerance (gi) and Morphine and related    Review of Systems   Review of Systems  Constitutional:  Negative for fever.  Musculoskeletal:  Positive for back pain.    Physical Exam Updated Vital Signs BP (!) 137/92   Pulse 83   Temp 98 F (36.7 C) (Oral)   Resp 20   Ht 1.93 m (6\' 4" )   Wt 69.9 kg   SpO2 98%   BMI 18.75 kg/m  Physical Exam CONSTITUTIONAL: Well developed/well nourished HEAD: Normocephalic/atraumatic EYES: EOMI ENMT: Mucous membranes moist NECK: supple no meningeal signs SPINE/BACK: Diffuse lumbar spinal and paraspinal tenderness.  Muscle tenderness is noted, no erythema or bruising is noted No bruising/crepitance/stepoffs noted to spine CV: S1/S2  noted, no murmurs/rubs/gallops noted LUNGS: Lungs are clear to auscultation bilaterally, no apparent distress ABDOMEN: soft, nontender, no rebound or guarding GU:no cva tenderness NEURO: Awake/alert, equal motor 5/5 strength noted with the following: hip flexion/knee flexion/extension, foot dorsi/plantar flexion, great toe extension intact bilaterally, no clonus bilaterally, no sensory deficit in any dermatome.  Pt is able to ambulate unassisted. EXTREMITIES: pulses normal, full ROM SKIN: warm, color normal PSYCH: no abnormalities of mood noted, alert and oriented to situation  ED Results / Procedures / Treatments   Labs (all labs ordered are listed, but only abnormal results are displayed) Labs Reviewed - No data to display  EKG None  Radiology No results found.  Procedures Procedures    Medications Ordered in ED Medications  lidocaine (LIDODERM) 5 % 1 patch (1 patch Transdermal Patch Applied 05/13/22 0403)  methocarbamol (ROBAXIN) tablet 500 mg (500 mg Oral Given 05/13/22 0338)    ED Course/ Medical Decision Making/ A&P Clinical Course as of 05/13/22 0457  Sun May 13, 2022  0454 Pt reports feeling improved. No distress noted.  No focal neuro deficits.  Suspect muscle spasm/strain  [DW]  May 15, 2022 Discussed appropriate use of medications, lidocaine patch and heating pad.  Discussed need to limit heavy lifting  [DW]  0455 We discussed strict  ER return precautions No indication for imaging at this time [  DW]    Clinical Course User Index [DW] Zadie Rhine, MD                           Medical Decision Making Risk Prescription drug management.   This patient presents to the ED for concern of back pain, this involves an extensive number of treatment options, and is a complaint that carries with it a high risk of complications and morbidity.  The differential diagnosis includes but is not limited to muscle spasm, epidural abscess, discitis, lumbosacral radiculopathy, AAA,  UTI, kidney stone  Social Determinants of Health: Patient's impaired access to primary care and tobacco use   increases the complexity of managing their presentation  Additional history obtained: Additional history obtained from significant other Records reviewed previous admission documents  Imaging Studies ordered: No indication for imaging  Medicines ordered and prescription drug management: I ordered medication including Robaxin for spasm Reevaluation of the patient after these medicines showed that the patient    improved  Test Considered: Consider spinal imaging, but patient is without any recent trauma, otherwise healthy, no neurodeficits  Reevaluation: After the interventions noted above, I reevaluated the patient and found that they have :improved  Complexity of problems addressed: Patient's presentation is most consistent with  acute, uncomplicated illness  Disposition: After consideration of the diagnostic results and the patient's response to treatment,  I feel that the patent would benefit from discharge   .           Final Clinical Impression(s) / ED Diagnoses Final diagnoses:  Muscle spasm    Rx / DC Orders ED Discharge Orders          Ordered    methocarbamol (ROBAXIN) 500 MG tablet  2 times daily        05/13/22 0450    lidocaine (LIDODERM) 5 %  Every 24 hours        05/13/22 0450              Zadie Rhine, MD 05/13/22 [phone]

## 2022-05-13 NOTE — Discharge Instructions (Signed)
You can take ibuprofen 400 mg every 6-8 hours for the next week to help with your pain  SEEK IMMEDIATE MEDICAL ATTENTION IF: New numbness, tingling, weakness, or problem with the use of your arms or legs.  Severe back pain not relieved with medications.  Change in bowel or bladder control (if you lose control of stool or urine, or if you are unable to urinate) Increasing pain in any areas of the body (such as chest or abdominal pain).  Shortness of breath, dizziness or fainting.  Nausea (feeling sick to your stomach), vomiting, fever, or sweats.

## 2022-06-15 ENCOUNTER — Emergency Department (HOSPITAL_COMMUNITY): Payer: Medicaid Other

## 2022-06-15 ENCOUNTER — Other Ambulatory Visit: Payer: Self-pay

## 2022-06-15 ENCOUNTER — Encounter (HOSPITAL_COMMUNITY): Payer: Self-pay

## 2022-06-15 ENCOUNTER — Emergency Department (HOSPITAL_COMMUNITY)
Admission: EM | Admit: 2022-06-15 | Discharge: 2022-06-15 | Disposition: A | Discharge status: Home / Self Care | Payer: Self-pay | Attending: Emergency Medicine | Admitting: Emergency Medicine

## 2022-06-15 DIAGNOSIS — M79605 Pain in left leg: Secondary | ICD-10-CM | POA: Insufficient documentation

## 2022-06-15 DIAGNOSIS — H469 Unspecified optic neuritis: Secondary | ICD-10-CM | POA: Insufficient documentation

## 2022-06-15 LAB — CBC WITH DIFFERENTIAL/PLATELET
Abs Immature Granulocytes: 0.01 10*3/uL (ref 0.00–0.07)
Basophils Absolute: 0.1 10*3/uL (ref 0.0–0.1)
Basophils Relative: 2 %
Eosinophils Absolute: 0.2 10*3/uL (ref 0.0–0.5)
Eosinophils Relative: 4 %
HCT: 35.8 % — ABNORMAL LOW (ref 39.0–52.0)
Hemoglobin: 11.8 g/dL — ABNORMAL LOW (ref 13.0–17.0)
Immature Granulocytes: 0 %
Lymphocytes Relative: 33 %
Lymphs Abs: 1.3 10*3/uL (ref 0.7–4.0)
MCH: 30.3 pg (ref 26.0–34.0)
MCHC: 33 g/dL (ref 30.0–36.0)
MCV: 92 fL (ref 80.0–100.0)
Monocytes Absolute: 0.5 10*3/uL (ref 0.1–1.0)
Monocytes Relative: 11 %
Neutro Abs: 2 10*3/uL (ref 1.7–7.7)
Neutrophils Relative %: 50 %
Platelets: 169 10*3/uL (ref 150–400)
RBC: 3.89 MIL/uL — ABNORMAL LOW (ref 4.22–5.81)
RDW: 14 % (ref 11.5–15.5)
WBC: 4 10*3/uL (ref 4.0–10.5)
nRBC: 0 % (ref 0.0–0.2)

## 2022-06-15 LAB — COMPREHENSIVE METABOLIC PANEL
ALT: 54 U/L — ABNORMAL HIGH (ref 0–44)
AST: 79 U/L — ABNORMAL HIGH (ref 15–41)
Albumin: 4.7 g/dL (ref 3.5–5.0)
Alkaline Phosphatase: 65 U/L (ref 38–126)
Anion gap: 9 (ref 5–15)
BUN: 6 mg/dL (ref 6–20)
CO2: 27 mmol/L (ref 22–32)
Calcium: 8.9 mg/dL (ref 8.9–10.3)
Chloride: 104 mmol/L (ref 98–111)
Creatinine, Ser: 0.79 mg/dL (ref 0.61–1.24)
GFR, Estimated: >60 mL/min (ref >60)
Glucose, Bld: 64 mg/dL — ABNORMAL LOW (ref 70–99)
Potassium: 4 mmol/L (ref 3.5–5.1)
Sodium: 140 mmol/L (ref 135–145)
Total Bilirubin: 0.5 mg/dL (ref 0.3–1.2)
Total Protein: 7.9 g/dL (ref 6.5–8.1)

## 2022-06-15 MED ORDER — NAPROXEN 500 MG PO TABS: 500.0000 mg | ORAL_TABLET | Freq: Two times a day (BID) | ORAL | 0 refills | Status: DC

## 2022-06-15 NOTE — Discharge Instructions (Signed)
Follow-up with Dr. Aline Brochure or family doctor for recheck if not improving

## 2022-06-15 NOTE — ED Triage Notes (Signed)
Patient brought in via POV with complaint of left lower leg numbness/tingling for the past few days. Denies injury, has hx of back pain.

## 2022-06-15 NOTE — ED Notes (Signed)
ED Provider at bedside. 

## 2022-06-15 NOTE — ED Provider Notes (Signed)
[facility] EMERGENCY DEPARTMENT Provider Note   CSN: [id number] Arrival date & time: 06/15/22  1517     History {Add pertinent medical, surgical, social history, OB history to HPI:1} Chief Complaint  Patient presents with   Leg Pain    Andrew Mccarty is a 41 y.o. male.  Patient complains of some numbness running down his left leg.  Patient has no past medical history   Leg Pain      Home Medications Prior to Admission medications   Medication Sig Start Date End Date Taking? Authorizing Provider  naproxen (NAPROSYN) 500 MG tablet Take 1 tablet (500 mg total) by mouth 2 (two) times daily. 06/15/22  Yes Milton Ferguson, MD  lidocaine (LIDODERM) 5 % Place 1 patch onto the skin daily. Remove & Discard patch within 12 hours or as directed by MD 05/13/22   Ripley Fraise, MD  methocarbamol (ROBAXIN) 500 MG tablet Take 1 tablet (500 mg total) by mouth 2 (two) times daily. 05/13/22   Ripley Fraise, MD  nicotine (NICODERM CQ - DOSED IN MG/24 HOURS) 14 mg/24hr patch Place 1 patch (14 mg total) onto the skin daily. 10/25/21   Mercy Riding, MD      Allergies    Lactose intolerance (gi) and Morphine and related    Review of Systems   Review of Systems  Physical Exam Updated Vital Signs BP (!) 108/95 (BP Location: Left Arm)   Pulse 70   Temp 98.7 F (37.1 C) (Oral)   Resp 14   Ht 6\' 4"  (1.93 m)   Wt 70.3 kg   SpO2 100%   BMI 18.87 kg/m  Physical Exam  ED Results / Procedures / Treatments   Labs (all labs ordered are listed, but only abnormal results are displayed) Labs Reviewed  CBC WITH DIFFERENTIAL/PLATELET - Abnormal; Notable for the following components:      Result Value   RBC 3.89 (*)    Hemoglobin 11.8 (*)    HCT 35.8 (*)    All other components within normal limits  COMPREHENSIVE METABOLIC PANEL - Abnormal; Notable for the following components:   Glucose, Bld 64 (*)    AST 79 (*)    ALT 54 (*)    All other components within normal limits     EKG None  Radiology DG Lumbar Spine Complete  Result Date: 06/15/2022 CLINICAL DATA:  Acute left lower leg numbness.  No known injury. EXAM: LUMBAR SPINE - COMPLETE 4+ VIEW COMPARISON:  None Available. FINDINGS: There is no evidence of lumbar spine fracture. Alignment is normal. Intervertebral disc spaces are maintained. IMPRESSION: Negative. Electronically Signed   By: Marijo Conception M.D.   On: 06/15/2022 16:35    Procedures Procedures  {Document cardiac monitor, telemetry assessment procedure when appropriate:1}  Medications Ordered in ED Medications - No data to display  ED Course/ Medical Decision Making/ A&P                           Medical Decision Making Amount and/or Complexity of Data Reviewed Labs: ordered. Radiology: ordered.  Risk Prescription drug management.   Patient with peripheral neuritis.  He will be sent home on Naprosyn and follow-up as an outpatient with family doctor or orthopedics  {Document critical care time when appropriate:1} {Document review of labs and clinical decision tools ie heart score, Chads2Vasc2 etc:1}  {Document your independent review of radiology images, and any outside records:1} {Document your discussion with family members,  caretakers, and with consultants:1} {Document social determinants of health affecting pt's care:1} {Document your decision making why or why not admission, treatments were needed:1} Final Clinical Impression(s) / ED Diagnoses Final diagnoses:  Optic neuritis    Rx / DC Orders ED Discharge Orders          Ordered    naproxen (NAPROSYN) 500 MG tablet  2 times daily        06/15/22 1928

## 2023-03-14 ENCOUNTER — Emergency Department (HOSPITAL_COMMUNITY)
Admission: EM | Admit: 2023-03-14 | Discharge: 2023-03-15 | Disposition: A | Discharge status: Home / Self Care | Payer: Medicaid Other | Attending: Emergency Medicine | Admitting: Emergency Medicine

## 2023-03-14 ENCOUNTER — Encounter (HOSPITAL_COMMUNITY): Payer: Self-pay | Admitting: Emergency Medicine

## 2023-03-14 ENCOUNTER — Other Ambulatory Visit: Payer: Self-pay

## 2023-03-14 ENCOUNTER — Emergency Department (HOSPITAL_COMMUNITY): Payer: Medicaid Other

## 2023-03-14 DIAGNOSIS — R0789 Other chest pain: Secondary | ICD-10-CM | POA: Diagnosis present

## 2023-03-14 DIAGNOSIS — F17200 Nicotine dependence, unspecified, uncomplicated: Secondary | ICD-10-CM | POA: Diagnosis not present

## 2023-03-14 DIAGNOSIS — J181 Lobar pneumonia, unspecified organism: Secondary | ICD-10-CM | POA: Insufficient documentation

## 2023-03-14 DIAGNOSIS — J189 Pneumonia, unspecified organism: Secondary | ICD-10-CM

## 2023-03-14 NOTE — ED Triage Notes (Addendum)
Pt in with pain to L anterior rib that began 1 hr ago. Worse with deep breaths, denies any injuries, states the pain woke him up out of sleep. Does have a np cough x 1 wk from URI, denies fevers

## 2023-03-15 ENCOUNTER — Emergency Department (HOSPITAL_COMMUNITY): Payer: Medicaid Other

## 2023-03-15 ENCOUNTER — Encounter (HOSPITAL_COMMUNITY): Payer: Self-pay | Admitting: Radiology

## 2023-03-15 LAB — CBC WITH DIFFERENTIAL/PLATELET
Abs Immature Granulocytes: 0.03 10*3/uL (ref 0.00–0.07)
Basophils Absolute: 0.1 10*3/uL (ref 0.0–0.1)
Basophils Relative: 1 %
Eosinophils Absolute: 0 10*3/uL (ref 0.0–0.5)
Eosinophils Relative: 0 %
HCT: 34.4 % — ABNORMAL LOW (ref 39.0–52.0)
Hemoglobin: 11.6 g/dL — ABNORMAL LOW (ref 13.0–17.0)
Immature Granulocytes: 0 %
Lymphocytes Relative: 16 %
Lymphs Abs: 1.5 10*3/uL (ref 0.7–4.0)
MCH: 29.8 pg (ref 26.0–34.0)
MCHC: 33.7 g/dL (ref 30.0–36.0)
MCV: 88.4 fL (ref 80.0–100.0)
Monocytes Absolute: 1 10*3/uL (ref 0.1–1.0)
Monocytes Relative: 11 %
Neutro Abs: 6.4 10*3/uL (ref 1.7–7.7)
Neutrophils Relative %: 72 %
Platelets: 376 10*3/uL (ref 150–400)
RBC: 3.89 MIL/uL — ABNORMAL LOW (ref 4.22–5.81)
RDW: 14.2 % (ref 11.5–15.5)
WBC: 9 10*3/uL (ref 4.0–10.5)
nRBC: 0 % (ref 0.0–0.2)

## 2023-03-15 LAB — BASIC METABOLIC PANEL
Anion gap: 14 (ref 5–15)
BUN: 5 mg/dL — ABNORMAL LOW (ref 6–20)
CO2: 24 mmol/L (ref 22–32)
Calcium: 8.6 mg/dL — ABNORMAL LOW (ref 8.9–10.3)
Chloride: 97 mmol/L — ABNORMAL LOW (ref 98–111)
Creatinine, Ser: 0.72 mg/dL (ref 0.61–1.24)
GFR, Estimated: >60 mL/min (ref >60)
Glucose, Bld: 163 mg/dL — ABNORMAL HIGH (ref 70–99)
Potassium: 3.9 mmol/L (ref 3.5–5.1)
Sodium: 135 mmol/L (ref 135–145)

## 2023-03-15 LAB — TROPONIN I (HIGH SENSITIVITY)
Troponin I (High Sensitivity): 7 ng/L (ref <18)
Troponin I (High Sensitivity): 7 ng/L (ref <18)

## 2023-03-15 MED ORDER — AMOXICILLIN-POT CLAVULANATE 875-125 MG PO TABS: 1.0000 | ORAL_TABLET | Freq: Two times a day (BID) | ORAL | 0 refills | Status: AC

## 2023-03-15 MED ORDER — IOHEXOL 350 MG/ML SOLN
75.0000 mL | Freq: Once | INTRAVENOUS | Status: AC | PRN
  Administered 2023-03-15: 75 mL via INTRAVENOUS

## 2023-03-15 MED ORDER — AMOXICILLIN-POT CLAVULANATE 875-125 MG PO TABS
1.0000 | ORAL_TABLET | Freq: Once | ORAL | Status: AC
  Administered 2023-03-15: 1 via ORAL
  Filled 2023-03-15: qty 1

## 2023-03-15 MED ORDER — KETOROLAC TROMETHAMINE 30 MG/ML IJ SOLN
30.0000 mg | Freq: Once | INTRAMUSCULAR | Status: AC
  Administered 2023-03-15: 30 mg via INTRAVENOUS
  Filled 2023-03-15: qty 1

## 2023-03-15 NOTE — ED Provider Notes (Signed)
[place] EMERGENCY DEPARTMENT AT [facility] Provider Note   CSN: [id number] Arrival date & time: 03/14/23  2343     History  Chief Complaint  Patient presents with   Rib Pain   Cough    JAYMIN WALN is a 42 y.o. male.  HPI     This is a 42 year old male who presents with chest pain.  Patient reports left-sided lower chest pain when taking deep breaths and coughing.  Patient reports he has had a cough for the last week.  Pain is sharp in nature.  It woke him up tonight.  He has not had any fevers.  Does have a history of pneumonia and is a current smoker.  He also reports a history of pneumothorax.  Home Medications Prior to Admission medications   Medication Sig Start Date End Date Taking? Authorizing Provider  amoxicillin-clavulanate (AUGMENTIN) 875-125 MG tablet Take 1 tablet by mouth every 12 (twelve) hours. 03/15/23  Yes Koree Staheli, Mayer Masker, MD  lidocaine (LIDODERM) 5 % Place 1 patch onto the skin daily. Remove & Discard patch within 12 hours or as directed by MD 05/13/22   Zadie Rhine, MD  methocarbamol (ROBAXIN) 500 MG tablet Take 1 tablet (500 mg total) by mouth 2 (two) times daily. 05/13/22   Zadie Rhine, MD  naproxen (NAPROSYN) 500 MG tablet Take 1 tablet (500 mg total) by mouth 2 (two) times daily. 06/15/22   Bethann Berkshire, MD  nicotine (NICODERM CQ - DOSED IN MG/24 HOURS) 14 mg/24hr patch Place 1 patch (14 mg total) onto the skin daily. 10/25/21   Almon Hercules, MD      Allergies    Lactose intolerance (gi) and Morphine and codeine    Review of Systems   Review of Systems  Constitutional:  Negative for fever.  Respiratory:  Positive for cough. Negative for shortness of breath.   Cardiovascular:  Positive for chest pain. Negative for leg swelling.  All other systems reviewed and are negative.   Physical Exam Updated Vital Signs BP 127/85   Pulse 86   Temp 98.3 F (36.8 C) (Oral)   Resp 20   Wt 74.8 kg   SpO2 91%   BMI 20.08 kg/m   Physical Exam Vitals and nursing note reviewed.  Constitutional:      Appearance: He is well-developed.     Comments: Tall, thin  HENT:     Head: Normocephalic and atraumatic.  Eyes:     Pupils: Pupils are equal, round, and reactive to light.  Cardiovascular:     Rate and Rhythm: Normal rate and regular rhythm.     Heart sounds: Normal heart sounds. No murmur heard. Pulmonary:     Effort: Pulmonary effort is normal. No respiratory distress.     Breath sounds: Normal breath sounds. No wheezing.  Abdominal:     Palpations: Abdomen is soft.     Tenderness: There is no abdominal tenderness. There is no rebound.  Musculoskeletal:     Cervical back: Neck supple.  Lymphadenopathy:     Cervical: No cervical adenopathy.  Skin:    General: Skin is warm and dry.  Neurological:     Mental Status: He is alert and oriented to person, place, and time.  Psychiatric:        Mood and Affect: Mood normal.     ED Results / Procedures / Treatments   Labs (all labs ordered are listed, but only abnormal results are displayed) Labs Reviewed  CBC WITH DIFFERENTIAL/PLATELET -  Abnormal; Notable for the following components:      Result Value   RBC 3.89 (*)    Hemoglobin 11.6 (*)    HCT 34.4 (*)    All other components within normal limits  BASIC METABOLIC PANEL - Abnormal; Notable for the following components:   Chloride 97 (*)    Glucose, Bld 163 (*)    BUN 5 (*)    Calcium 8.6 (*)    All other components within normal limits  TROPONIN I (HIGH SENSITIVITY)  TROPONIN I (HIGH SENSITIVITY)    EKG None  Radiology CT Angio Chest PE W and/or Wo Contrast  Result Date: 03/15/2023 CLINICAL DATA:  Left-sided chest pain EXAM: CT ANGIOGRAPHY CHEST WITH CONTRAST TECHNIQUE: Multidetector CT imaging of the chest was performed using the standard protocol during bolus administration of intravenous contrast. Multiplanar CT image reconstructions and MIPs were obtained to evaluate the vascular anatomy.  RADIATION DOSE REDUCTION: This exam was performed according to the departmental dose-optimization program which includes automated exposure control, adjustment of the mA and/or kV according to patient size and/or use of iterative reconstruction technique. CONTRAST:  75mL OMNIPAQUE IOHEXOL 350 MG/ML SOLN COMPARISON:  Chest x-ray from earlier in the same day. FINDINGS: Cardiovascular: Thoracic aorta is within normal limits. No cardiac enlargement is noted. The pulmonary artery shows a normal branching pattern. No filling defect to suggest pulmonary embolism is noted. No significant coronary calcifications are noted. Mediastinum/Nodes: Thoracic inlet is within normal limits. No hilar or mediastinal adenopathy is noted. The esophagus as visualized is within normal limits. Lungs/Pleura: Lungs are well aerated bilaterally. Patchy airspace opacities are noted particularly in the lower lobes consistent with acute infiltrate. This is similar to that noted on prior chest x-ray. Mild emphysematous changes are noted. Scarring in the apices is noted bilaterally. Upper Abdomen: Visualized upper abdomen is unremarkable. Musculoskeletal: No chest wall abnormality. No acute or significant osseous findings. Review of the MIP images confirms the above findings. IMPRESSION: Bilateral lower lobe infiltrates worse on the left than the right similar to that seen on prior plain film. Emphysematous changes with apical scarring. No evidence of pulmonary emboli. Electronically Signed   By: Alcide Clever M.D.   On: 03/15/2023 01:26   DG Chest Portable 1 View  Result Date: 03/15/2023 CLINICAL DATA:  Chest pain. EXAM: PORTABLE CHEST 1 VIEW COMPARISON:  Chest radiograph dated 10/16/2021 and CT dated 10/22/2021. FINDINGS: Background of emphysema and right apical cystic changes and scarring, likely sequela of prior pneumonia. Postsurgical changes of the left upper lobe. Interval development of infiltrate in the left lower lung field. No pleural  effusion or pneumothorax. The cardiac silhouette is within normal limits. No acute osseous pathology. IMPRESSION: 1. Left lower lung field infiltrate. 2. Emphysema. Electronically Signed   By: Elgie Collard M.D.   On: 03/15/2023 00:08    Procedures Procedures    Medications Ordered in ED Medications  iohexol (OMNIPAQUE) 350 MG/ML injection 75 mL (75 mLs Intravenous Contrast Given 03/15/23 0119)  ketorolac (TORADOL) 30 MG/ML injection 30 mg (30 mg Intravenous Given 03/15/23 0144)  amoxicillin-clavulanate (AUGMENTIN) 875-125 MG per tablet 1 tablet (1 tablet Oral Given 03/15/23 0144)    ED Course/ Medical Decision Making/ A&P                             Medical Decision Making Amount and/or Complexity of Data Reviewed Labs: ordered. Radiology: ordered.  Risk Prescription drug management.   This patient  presents to the ED for concern of chest pain, this involves an extensive number of treatment options, and is a complaint that carries with it a high risk of complications and morbidity.  I considered the following differential and admission for this acute, potentially life threatening condition.  The differential diagnosis includes ACS, PE, pneumothorax, pneumonia, costochondritis  MDM:    Patient presents with left lower chest pain.  He is nontoxic and vital signs are reassuring.  He is afebrile.  No wheezing or pulmonary findings on exam.  Chest x-ray shows no evidence of pneumothorax.  He does have what appears to be a left lower lobe infiltrate.  No leukocytosis or fever.  Makes me question whether or not this may be something other than pneumonia.  He was tachycardic upon arrival and his O2 sat was 91%.  For this reason, will obtain CT PE study to rule out pulmonary infarct.  CT PE study does not show any evidence of PE but does show bilateral infiltrates.  Over 1 year ago he was hospitalized for multifocal pneumonia and was placed on Augmentin.  He was given a dose of Augmentin here  and we will treat him for community-acquired pneumonia.  EKG is largely reassuring and troponin is negative.  (Labs, imaging, consults)  Labs: I Ordered, and personally interpreted labs.  The pertinent results include: CBC, BMP, troponin  Imaging Studies ordered: I ordered imaging studies including chest x-ray, CT PE study I independently visualized and interpreted imaging. I agree with the radiologist interpretation  Additional history obtained from chart review.  External records from outside source obtained and reviewed including prior hospitalization and discharge summary  Cardiac Monitoring: The patient was maintained on a cardiac monitor.  If on the cardiac monitor, I personally viewed and interpreted the cardiac monitored which showed an underlying rhythm of: Sinus rhythm  Reevaluation: After the interventions noted above, I reevaluated the patient and found that they have :improved  Social Determinants of Health:  lives independently, smoker  Disposition: Discharge  Co morbidities that complicate the patient evaluation  Past Medical History:  Diagnosis Date   Collapsed lung      Medicines Meds ordered this encounter  Medications   iohexol (OMNIPAQUE) 350 MG/ML injection 75 mL   ketorolac (TORADOL) 30 MG/ML injection 30 mg   amoxicillin-clavulanate (AUGMENTIN) 875-125 MG per tablet 1 tablet   amoxicillin-clavulanate (AUGMENTIN) 875-125 MG tablet    Sig: Take 1 tablet by mouth every 12 (twelve) hours.    Dispense:  14 tablet    Refill:  0    I have reviewed the patients home medicines and have made adjustments as needed  Problem List / ED Course: Problem List Items Addressed This Visit   None Visit Diagnoses     Community acquired pneumonia of left lower lobe of lung    -  Primary   Relevant Medications   amoxicillin-clavulanate (AUGMENTIN) 875-125 MG per tablet 1 tablet (Completed)   amoxicillin-clavulanate (AUGMENTIN) 875-125 MG tablet                    Final Clinical Impression(s) / ED Diagnoses Final diagnoses:  Community acquired pneumonia of left lower lobe of lung    Rx / DC Orders ED Discharge Orders          Ordered    amoxicillin-clavulanate (AUGMENTIN) 875-125 MG tablet  Every 12 hours        03/15/23 0149  Shon Baton, MD 03/15/23 [phone]

## 2023-03-15 NOTE — ED Notes (Signed)
Patient called and requested prescription be called into a pharmacy that delivers as he has no transportation. Dr. Maxwell Marion approved. [facility] called and prescription order given to Robin. Attempted to call patient to notify him that prescription has been called in but no answer.

## 2023-03-15 NOTE — ED Notes (Signed)
Patient transported to CT 

## 2023-03-15 NOTE — ED Notes (Signed)
Patient called and asked to have Augmentin prescription called in to a drugstore that delivers.

## 2023-03-15 NOTE — Discharge Instructions (Signed)
You are seen today for chest pain and cough.  Your workup is notable for pneumonia.  Take the antibiotics as prescribed.

## 2023-12-31 ENCOUNTER — Encounter (HOSPITAL_COMMUNITY): Payer: Self-pay | Admitting: *Deleted

## 2023-12-31 ENCOUNTER — Emergency Department (HOSPITAL_COMMUNITY)

## 2023-12-31 ENCOUNTER — Emergency Department (HOSPITAL_COMMUNITY)
Admission: EM | Admit: 2023-12-31 | Discharge: 2023-12-31 | Disposition: A | Discharge status: Home / Self Care | Attending: Emergency Medicine | Admitting: Emergency Medicine

## 2023-12-31 ENCOUNTER — Other Ambulatory Visit: Payer: Self-pay

## 2023-12-31 DIAGNOSIS — R112 Nausea with vomiting, unspecified: Secondary | ICD-10-CM | POA: Insufficient documentation

## 2023-12-31 DIAGNOSIS — R7309 Other abnormal glucose: Secondary | ICD-10-CM | POA: Insufficient documentation

## 2023-12-31 DIAGNOSIS — E162 Hypoglycemia, unspecified: Secondary | ICD-10-CM

## 2023-12-31 DIAGNOSIS — R059 Cough, unspecified: Secondary | ICD-10-CM | POA: Insufficient documentation

## 2023-12-31 DIAGNOSIS — B349 Viral infection, unspecified: Secondary | ICD-10-CM

## 2023-12-31 LAB — RESP PANEL BY RT-PCR (RSV, FLU A&B, COVID)  RVPGX2
Influenza A by PCR: NEGATIVE
Influenza B by PCR: NEGATIVE
Resp Syncytial Virus by PCR: NEGATIVE
SARS Coronavirus 2 by RT PCR: NEGATIVE

## 2023-12-31 LAB — CBC WITH DIFFERENTIAL/PLATELET
Abs Immature Granulocytes: 0.02 10*3/uL (ref 0.00–0.07)
Basophils Absolute: 0 10*3/uL (ref 0.0–0.1)
Basophils Relative: 1 %
Eosinophils Absolute: 0 10*3/uL (ref 0.0–0.5)
Eosinophils Relative: 0 %
HCT: 39.3 % (ref 39.0–52.0)
Hemoglobin: 13 g/dL (ref 13.0–17.0)
Immature Granulocytes: 0 %
Lymphocytes Relative: 17 %
Lymphs Abs: 1.1 10*3/uL (ref 0.7–4.0)
MCH: 29.1 pg (ref 26.0–34.0)
MCHC: 33.1 g/dL (ref 30.0–36.0)
MCV: 88.1 fL (ref 80.0–100.0)
Monocytes Absolute: 0.4 10*3/uL (ref 0.1–1.0)
Monocytes Relative: 6 %
Neutro Abs: 5.1 10*3/uL (ref 1.7–7.7)
Neutrophils Relative %: 76 %
Platelets: 196 10*3/uL (ref 150–400)
RBC: 4.46 MIL/uL (ref 4.22–5.81)
RDW: 14.6 % (ref 11.5–15.5)
WBC: 6.7 10*3/uL (ref 4.0–10.5)
nRBC: 0 % (ref 0.0–0.2)

## 2023-12-31 LAB — COMPREHENSIVE METABOLIC PANEL WITH GFR
ALT: 68 U/L — ABNORMAL HIGH (ref 0–44)
AST: 129 U/L — ABNORMAL HIGH (ref 15–41)
Albumin: 4.6 g/dL (ref 3.5–5.0)
Alkaline Phosphatase: 67 U/L (ref 38–126)
Anion gap: 17 — ABNORMAL HIGH (ref 5–15)
BUN: 10 mg/dL (ref 6–20)
CO2: 24 mmol/L (ref 22–32)
Calcium: 9.3 mg/dL (ref 8.9–10.3)
Chloride: 94 mmol/L — ABNORMAL LOW (ref 98–111)
Creatinine, Ser: 0.77 mg/dL (ref 0.61–1.24)
GFR, Estimated: >60 mL/min (ref >60)
Glucose, Bld: 63 mg/dL — ABNORMAL LOW (ref 70–99)
Potassium: 5.2 mmol/L — ABNORMAL HIGH (ref 3.5–5.1)
Sodium: 135 mmol/L (ref 135–145)
Total Bilirubin: 0.7 mg/dL (ref 0.0–1.2)
Total Protein: 8.2 g/dL — ABNORMAL HIGH (ref 6.5–8.1)

## 2023-12-31 LAB — CBG MONITORING, ED
Glucose-Capillary: 41 mg/dL — CL (ref 70–99)
Glucose-Capillary: 55 mg/dL — ABNORMAL LOW (ref 70–99)
Glucose-Capillary: 220 mg/dL — ABNORMAL HIGH (ref 70–99)
Glucose-Capillary: 66 mg/dL — ABNORMAL LOW (ref 70–99)
Glucose-Capillary: 212 mg/dL — ABNORMAL HIGH (ref 70–99)

## 2023-12-31 LAB — MAGNESIUM: Magnesium: 1.9 mg/dL (ref 1.7–2.4)

## 2023-12-31 MED ORDER — ONDANSETRON HCL 4 MG/2ML IJ SOLN
4.0000 mg | Freq: Once | INTRAMUSCULAR | Status: AC
  Administered 2023-12-31: 4 mg via INTRAVENOUS
  Filled 2023-12-31: qty 2

## 2023-12-31 MED ORDER — BENZONATATE 100 MG PO CAPS: 100.0000 mg | ORAL_CAPSULE | Freq: Three times a day (TID) | ORAL | 0 refills | Status: AC

## 2023-12-31 MED ORDER — SODIUM CHLORIDE 0.9 % IV BOLUS
1000.0000 mL | Freq: Once | INTRAVENOUS | Status: AC
  Administered 2023-12-31: 1000 mL via INTRAVENOUS

## 2023-12-31 MED ORDER — DICYCLOMINE HCL 10 MG/ML IM SOLN
20.0000 mg | Freq: Once | INTRAMUSCULAR | Status: AC
  Administered 2023-12-31: 20 mg via INTRAMUSCULAR
  Filled 2023-12-31: qty 2

## 2023-12-31 MED ORDER — DEXTROSE 50 % IV SOLN
25.0000 mL | Freq: Once | INTRAVENOUS | Status: AC
  Administered 2023-12-31: 25 mL via INTRAVENOUS
  Filled 2023-12-31: qty 50

## 2023-12-31 MED ORDER — ONDANSETRON 4 MG PO TBDP: 4.0000 mg | ORAL_TABLET | Freq: Three times a day (TID) | ORAL | 0 refills | Status: AC | PRN

## 2023-12-31 MED ORDER — NAPROXEN 500 MG PO TABS: 500.0000 mg | ORAL_TABLET | Freq: Two times a day (BID) | ORAL | 0 refills | Status: AC

## 2023-12-31 NOTE — Discharge Instructions (Signed)
 Please follow-up closely with a primary care doctor on an outpatient basis.  Return to emergency department immediately for any new or worsening symptoms.  [facility] Primary Care Doctor List    Syliva Overman, MD. Specialty: [facility] Medicine Contact information: [address]  [place] [address]  [phone]   Lilyan Punt, MD. Specialty: [facility] Medicine Contact information: [address]  [place] [address]  [phone]   Avon Gully, MD Specialty: Internal Medicine Contact information: [address] [place] [address]  [phone]   Catalina Pizza, MD. Specialty: Internal Medicine Contact information: [address]  [place] [address]  [phone]    [facility] (Dr. Selena Batten) Specialty: Family Medicine Contact information: [address]  [place] [address]  [phone]   John Giovanni, MD. Specialty: [facility] Medicine Contact information: [address]  [address]  [place] [address]  [phone]   Carylon Perches, MD. Specialty: Internal Medicine Contact information: [address]  [address]  [place] [address]  [phone]   [facility] Family Medicine: [address] [phone]  [facility] Ace, Family medicine [address]  [phone]  [facility] [address] [place], [place] [phone]  [facility]: [address] [phone]    [facility] - Benita Stabile  [address] [place], [place] [address] [phone]  Services [facility] offers a variety of basic health services.  Services include but are not limited to: Blood pressure checks  Heart rate checks  Blood sugar checks  Urine analysis  Rapid strep tests  Pregnancy tests.  Health education and referrals  People needing more complex services will be directed to a physician online. Using these virtual visits, doctors can evaluate  and prescribe medicine and treatments. There will be no medication on-site, though [facility] will help patients fill their prescriptions at little to no cost.   For More information please go to: [email or web address]  Allergy and Asthma:    [address] [place] Ace [phone]  Urology:  [address]  [place] [phone]  [facility]  [address] [place], [place] [phone]  Orthopedics   [address] [place], [place] [phone]  Endocrinology  [address] [place], [place] [phone]  Podiatry: [facility] [phone]

## 2023-12-31 NOTE — ED Notes (Signed)
 ED Provider at bedside.

## 2023-12-31 NOTE — ED Triage Notes (Signed)
 Pt in c/o headaches & body aches with rhinorrhea onset x 2 days, pt reports fever & chills, A&O x4

## 2023-12-31 NOTE — ED Provider Notes (Signed)
  EMERGENCY DEPARTMENT AT [facility] Provider Note   CSN: [id number] Arrival date & time: 12/31/23  1014     History  No chief complaint on file.   Andrew Mccarty is a 43 y.o. male.  Patient is a 43 year old male who presents emergency department the chief complaint of cough, congestion, rhinorrhea, body aches, nausea, vomiting, diarrhea for approximate the past 2 days.  He denies any known sick contacts.  He has had no associated abdominal pain.  He denies any chest pain or shortness of breath.  He has had no dizziness, lightheadedness or syncope.  He denies any associated fever but does admit to chills.        Home Medications Prior to Admission medications   Medication Sig Start Date End Date Taking? Authorizing Provider  amoxicillin-clavulanate (AUGMENTIN) 875-125 MG tablet Take 1 tablet by mouth every 12 (twelve) hours. 03/15/23   Horton, Mayer Masker, MD  lidocaine (LIDODERM) 5 % Place 1 patch onto the skin daily. Remove & Discard patch within 12 hours or as directed by MD 05/13/22   Zadie Rhine, MD  methocarbamol (ROBAXIN) 500 MG tablet Take 1 tablet (500 mg total) by mouth 2 (two) times daily. 05/13/22   Zadie Rhine, MD  naproxen (NAPROSYN) 500 MG tablet Take 1 tablet (500 mg total) by mouth 2 (two) times daily. 06/15/22   Bethann Berkshire, MD  nicotine (NICODERM CQ - DOSED IN MG/24 HOURS) 14 mg/24hr patch Place 1 patch (14 mg total) onto the skin daily. 10/25/21   Almon Hercules, MD      Allergies    Lactose intolerance (gi) and Morphine and codeine    Review of Systems   Review of Systems  Respiratory:  Positive for cough.   Gastrointestinal:  Positive for diarrhea, nausea and vomiting.  All other systems reviewed and are negative.   Physical Exam Updated Vital Signs BP (!) 143/80 (BP Location: Left Arm)   Pulse 75   Temp 97.8 F (36.6 C) (Oral)   Resp 16   Ht 6\' 4"  (1.93 m)   Wt 70.3 kg   SpO2 98%   BMI 18.87 kg/m  Physical  Exam Vitals and nursing note reviewed.  Constitutional:      Appearance: Normal appearance.  HENT:     Head: Normocephalic and atraumatic.     Nose: Nose normal.     Mouth/Throat:     Mouth: Mucous membranes are moist.     Pharynx: No oropharyngeal exudate or posterior oropharyngeal erythema.  Eyes:     Extraocular Movements: Extraocular movements intact.     Conjunctiva/sclera: Conjunctivae normal.     Pupils: Pupils are equal, round, and reactive to light.  Cardiovascular:     Rate and Rhythm: Normal rate and regular rhythm.     Pulses: Normal pulses.     Heart sounds: Normal heart sounds. No murmur heard.    No gallop.  Pulmonary:     Effort: Pulmonary effort is normal. No respiratory distress.     Breath sounds: Normal breath sounds. No stridor. No wheezing, rhonchi or rales.  Abdominal:     General: Abdomen is flat. Bowel sounds are normal. There is no distension.     Palpations: Abdomen is soft.     Tenderness: There is no abdominal tenderness. There is no guarding.  Musculoskeletal:        General: Normal range of motion.     Cervical back: Normal range of motion and neck supple.  Skin:  General: Skin is warm and dry.     Findings: No rash.  Neurological:     General: No focal deficit present.     Mental Status: He is alert and oriented to person, place, and time. Mental status is at baseline.     Cranial Nerves: No cranial nerve deficit.     Sensory: No sensory deficit.     Motor: No weakness.     Coordination: Coordination normal.     Gait: Gait normal.  Psychiatric:        Mood and Affect: Mood normal.        Behavior: Behavior normal.        Thought Content: Thought content normal.        Judgment: Judgment normal.     ED Results / Procedures / Treatments   Labs (all labs ordered are listed, but only abnormal results are displayed) Labs Reviewed  COMPREHENSIVE METABOLIC PANEL WITH GFR - Abnormal; Notable for the following components:      Result Value    Potassium 5.2 (*)    Chloride 94 (*)    Glucose, Bld 63 (*)    Total Protein 8.2 (*)    AST 129 (*)    ALT 68 (*)    Anion gap 17 (*)    All other components within normal limits  CBG MONITORING, ED - Abnormal; Notable for the following components:   Glucose-Capillary 41 (*)    All other components within normal limits  CBG MONITORING, ED - Abnormal; Notable for the following components:   Glucose-Capillary 55 (*)    All other components within normal limits  CBG MONITORING, ED - Abnormal; Notable for the following components:   Glucose-Capillary 66 (*)    All other components within normal limits  CBG MONITORING, ED - Abnormal; Notable for the following components:   Glucose-Capillary 220 (*)    All other components within normal limits  CBG MONITORING, ED - Abnormal; Notable for the following components:   Glucose-Capillary 212 (*)    All other components within normal limits  RESP PANEL BY RT-PCR (RSV, FLU A&B, COVID)  RVPGX2  CBC WITH DIFFERENTIAL/PLATELET  MAGNESIUM    EKG None  Radiology DG Chest Port 1 View Result Date: 12/31/2023 CLINICAL DATA:  Productive cough. EXAM: PORTABLE CHEST 1 VIEW COMPARISON:  Radiograph and CT 03/15/2023 FINDINGS: Emphysema with biapical blebs. Chain sutures at the left lung apex. The heart is normal in size. Normal mediastinal contours. Resolution of previous left lung base opacity. No acute airspace disease. No pulmonary edema, pleural effusion, or pneumothorax. On limited assessment, no acute osseous findings. IMPRESSION: Emphysema with biapical blebs. No acute chest findings. Electronically Signed   By: Narda Rutherford M.D.   On: 12/31/2023 15:31    Procedures Procedures    Medications Ordered in ED Medications  sodium chloride 0.9 % bolus 1,000 mL (0 mLs Intravenous Stopped 12/31/23 1457)  ondansetron (ZOFRAN) injection 4 mg (4 mg Intravenous Given 12/31/23 1356)  dextrose 50 % solution 25 mL (25 mLs Intravenous Given 12/31/23 1551)   dicyclomine (BENTYL) injection 20 mg (20 mg Intramuscular Given 12/31/23 1552)  ondansetron (ZOFRAN) injection 4 mg (4 mg Intravenous Given 12/31/23 1552)    ED Course/ Medical Decision Making/ A&P                                 Medical Decision Making Amount and/or Complexity of Data Reviewed Labs:  ordered. Radiology: ordered.  Risk Prescription drug management.   This patient presents to the ED for concern of cough, congestion, rhinorrhea, sore throat, nausea, vomiting, diarrhea differential diagnosis includes acute viral syndrome, pneumonia, sepsis, electrolyte derangement, dehydration    Additional history obtained:  Additional history obtained from none External records from outside source obtained and reviewed including none   Lab Tests:  I Ordered, and personally interpreted labs.  The pertinent results include: Mild elevation of liver enzymes, normal kidney function, no leukocytosis, no anemia, hypoglycemia, normal magnesium, negative viral swab   Imaging Studies ordered:  I ordered imaging studies including chest x-ray I independently visualized and interpreted imaging which showed no acute cardiopulmonary process I agree with the radiologist interpretation   Medicines ordered and prescription drug management:  I ordered medication including IV fluids, Zofran, Bentyl, dextrose for hypoglycemia, nausea vomiting Reevaluation of the patient after these medicines showed that the patient improved I have reviewed the patients home medicines and have made adjustments as needed   Problem List / ED Course:  Patient is doing much better at this time and is stable for discharge home.  He is tolerating p.o. intake at this point without difficulty.  Do suspect that his hypoglycemic episode may be secondary to his nausea and vomiting as well as his acute viral syndrome.  Blood work has been otherwise unremarkable at this point.  Chest x-ray demonstrated no indication for  pneumonia.  Abdominal exam is benign with no focal tenderness throughout.  Do not suspect acute appendicitis, cholecystitis, bowel torsion, diverticulitis, testicular torsion, pyelonephritis, kidney stone.  The need for close follow-up with a primary care doctor on an outpatient basis was discussed as well as strict return precautions for any new or worsening symptoms.  Patient voiced understanding and had no additional questions.   Social Determinants of Health:  None           Final Clinical Impression(s) / ED Diagnoses Final diagnoses:  None    Rx / DC Orders ED Discharge Orders     None         Kathlen Mody 12/31/23 1722    Gloris Manchester, MD 01/01/24 0030

## 2023-12-31 NOTE — ED Notes (Signed)
 Pts CBG 41. EDP Notified. Pt provided juice and snack and verbalizes understanding to recheck his CBG level in 15 minutes.

## 2024-04-03 ENCOUNTER — Emergency Department (HOSPITAL_COMMUNITY)
Admission: EM | Admit: 2024-04-03 | Discharge: 2024-04-04 | Disposition: A | Discharge status: Home / Self Care | Attending: Emergency Medicine | Admitting: Emergency Medicine

## 2024-04-03 ENCOUNTER — Encounter (HOSPITAL_COMMUNITY): Payer: Self-pay

## 2024-04-03 ENCOUNTER — Other Ambulatory Visit: Payer: Self-pay

## 2024-04-03 DIAGNOSIS — M791 Myalgia, unspecified site: Secondary | ICD-10-CM | POA: Insufficient documentation

## 2024-04-03 DIAGNOSIS — R52 Pain, unspecified: Secondary | ICD-10-CM

## 2024-04-03 LAB — CBC WITH DIFFERENTIAL/PLATELET
Abs Immature Granulocytes: 0.03 K/uL (ref 0.00–0.07)
Basophils Absolute: 0.1 K/uL (ref 0.0–0.1)
Basophils Relative: 1 %
Eosinophils Absolute: 0.2 K/uL (ref 0.0–0.5)
Eosinophils Relative: 4 %
HCT: 33.2 % — ABNORMAL LOW (ref 39.0–52.0)
Hemoglobin: 10.6 g/dL — ABNORMAL LOW (ref 13.0–17.0)
Immature Granulocytes: 1 %
Lymphocytes Relative: 25 %
Lymphs Abs: 1.4 K/uL (ref 0.7–4.0)
MCH: 29.9 pg (ref 26.0–34.0)
MCHC: 31.9 g/dL (ref 30.0–36.0)
MCV: 93.5 fL (ref 80.0–100.0)
Monocytes Absolute: 0.6 K/uL (ref 0.1–1.0)
Monocytes Relative: 11 %
Neutro Abs: 3.2 K/uL (ref 1.7–7.7)
Neutrophils Relative %: 58 %
Platelets: 217 K/uL (ref 150–400)
RBC: 3.55 MIL/uL — ABNORMAL LOW (ref 4.22–5.81)
RDW: 13.8 % (ref 11.5–15.5)
WBC: 5.5 K/uL (ref 4.0–10.5)
nRBC: 0 % (ref 0.0–0.2)

## 2024-04-03 LAB — COMPREHENSIVE METABOLIC PANEL WITH GFR
ALT: 17 U/L (ref 0–44)
AST: 22 U/L (ref 15–41)
Albumin: 4 g/dL (ref 3.5–5.0)
Alkaline Phosphatase: 121 U/L (ref 38–126)
Anion gap: 11 (ref 5–15)
BUN: 11 mg/dL (ref 6–20)
CO2: 26 mmol/L (ref 22–32)
Calcium: 9.2 mg/dL (ref 8.9–10.3)
Chloride: 101 mmol/L (ref 98–111)
Creatinine, Ser: 0.67 mg/dL (ref 0.61–1.24)
GFR, Estimated: >60 mL/min (ref >60)
Glucose, Bld: 90 mg/dL (ref 70–99)
Potassium: 4.6 mmol/L (ref 3.5–5.1)
Sodium: 138 mmol/L (ref 135–145)
Total Bilirubin: 0.4 mg/dL (ref 0.0–1.2)
Total Protein: 7.6 g/dL (ref 6.5–8.1)

## 2024-04-03 MED ORDER — SODIUM CHLORIDE 0.9 % IV BOLUS
1000.0000 mL | Freq: Once | INTRAVENOUS | Status: AC
  Administered 2024-04-04: 1000 mL via INTRAVENOUS

## 2024-04-03 NOTE — ED Triage Notes (Signed)
 Patient reports his muscles were hurting and took an unknown substance muscle relaxer from his cousin who is paralyzed.  Took it last night and reports still feeling weird.

## 2024-04-03 NOTE — ED Provider Notes (Signed)
 [place] EMERGENCY DEPARTMENT AT [facility] Provider Note   CSN: [id number] Arrival date & time: 04/03/24  1623     Patient presents with: Generalized Body Aches   Andrew Mccarty is a 43 y.o. male.  {Add pertinent medical, surgical, social history, OB history to YEP:67052} Presents to the emergency department stating that his muscles are hurting and cramping all over.  Symptoms ongoing for 2 days.  He has not been doing any unusual activity.  He was given a muscle relaxer by his cousin to help with the cramping which made him feel strange.       Prior to Admission medications   Medication Sig Start Date End Date Taking? Authorizing Provider  amoxicillin -clavulanate (AUGMENTIN ) 875-125 MG tablet Take 1 tablet by mouth every 12 (twelve) hours. 03/15/23   Horton, Charmaine FALCON, MD  benzonatate  (TESSALON ) 100 MG capsule Take 1 capsule (100 mg total) by mouth every 8 (eight) hours. 12/31/23   Daralene Lonni BIRCH, PA-C  lidocaine  (LIDODERM ) 5 % Place 1 patch onto the skin daily. Remove & Discard patch within 12 hours or as directed by MD 05/13/22   Midge Golas, MD  methocarbamol  (ROBAXIN ) 500 MG tablet Take 1 tablet (500 mg total) by mouth 2 (two) times daily. 05/13/22   Midge Golas, MD  naproxen  (NAPROSYN ) 500 MG tablet Take 1 tablet (500 mg total) by mouth 2 (two) times daily. 12/31/23   Daralene Lonni BIRCH, PA-C  nicotine  (NICODERM CQ  - DOSED IN MG/24 HOURS) 14 mg/24hr patch Place 1 patch (14 mg total) onto the skin daily. 10/25/21   Gonfa, Taye T, MD  ondansetron  (ZOFRAN -ODT) 4 MG disintegrating tablet Take 1 tablet (4 mg total) by mouth every 8 (eight) hours as needed for nausea or vomiting. 12/31/23   Daralene Lonni BIRCH, PA-C    Allergies: Lactose intolerance (gi) and Morphine  and codeine    Review of Systems  Updated Vital Signs BP (!) 132/93 (BP Location: Right Arm)   Pulse 74   Temp 99 F (37.2 C) (Oral)   Resp 16   Ht 6' 4 (1.93 m)   Wt 70.3 kg   SpO2  97%   BMI 18.87 kg/m   Physical Exam Vitals and nursing note reviewed.  Constitutional:      General: He is not in acute distress.    Appearance: He is well-developed.  HENT:     Head: Normocephalic and atraumatic.     Mouth/Throat:     Mouth: Mucous membranes are moist.  Eyes:     General: Vision grossly intact. Gaze aligned appropriately.     Extraocular Movements: Extraocular movements intact.     Conjunctiva/sclera: Conjunctivae normal.  Cardiovascular:     Rate and Rhythm: Normal rate and regular rhythm.     Pulses: Normal pulses.     Heart sounds: Normal heart sounds, S1 normal and S2 normal. No murmur heard.    No friction rub. No gallop.  Pulmonary:     Effort: Pulmonary effort is normal. No respiratory distress.     Breath sounds: Normal breath sounds.  Abdominal:     Palpations: Abdomen is soft.     Tenderness: There is no abdominal tenderness. There is no guarding or rebound.     Hernia: No hernia is present.  Musculoskeletal:        General: No swelling.     Cervical back: Full passive range of motion without pain, normal range of motion and neck supple. No pain with movement, spinous  process tenderness or muscular tenderness. Normal range of motion.     Right lower leg: No edema.     Left lower leg: No edema.  Skin:    General: Skin is warm and dry.     Capillary Refill: Capillary refill takes less than 2 seconds.     Findings: No ecchymosis, erythema, lesion or wound.  Neurological:     Mental Status: He is alert and oriented to person, place, and time.     GCS: GCS eye subscore is 4. GCS verbal subscore is 5. GCS motor subscore is 6.     Cranial Nerves: Cranial nerves 2-12 are intact.     Sensory: Sensation is intact.     Motor: Motor function is intact. No weakness or abnormal muscle tone.     Coordination: Coordination is intact.  Psychiatric:        Mood and Affect: Mood normal.        Speech: Speech normal.        Behavior: Behavior normal.      (all labs ordered are listed, but only abnormal results are displayed) Labs Reviewed  CBC WITH DIFFERENTIAL/PLATELET - Abnormal; Notable for the following components:      Result Value   RBC 3.55 (*)    Hemoglobin 10.6 (*)    HCT 33.2 (*)    All other components within normal limits  RESP PANEL BY RT-PCR (RSV, FLU A&B, COVID)  RVPGX2  COMPREHENSIVE METABOLIC PANEL WITH GFR  URINALYSIS, ROUTINE W REFLEX MICROSCOPIC  RAPID URINE DRUG SCREEN, HOSP PERFORMED  CK    EKG: None  Radiology: No results found.  {Document cardiac monitor, telemetry assessment procedure when appropriate:32947} Procedures   Medications Ordered in the ED  sodium chloride  0.9 % bolus 1,000 mL (has no administration in time range)      {Click here for ABCD2, HEART and other calculators REFRESH Note before signing:1}                              Medical Decision Making Amount and/or Complexity of Data Reviewed Labs: ordered.   ***  {Document critical care time when appropriate  Document review of labs and clinical decision tools ie CHADS2VASC2, etc  Document your independent review of radiology images and any outside records  Document your discussion with family members, caretakers and with consultants  Document social determinants of health affecting pt's care  Document your decision making why or why not admission, treatments were needed:32947:::1}   Final diagnoses:  None    ED Discharge Orders     None

## 2024-04-04 LAB — CK: Total CK: 126 U/L (ref 49–397)

## 2024-04-04 LAB — RESP PANEL BY RT-PCR (RSV, FLU A&B, COVID)  RVPGX2
Influenza A by PCR: NEGATIVE
Influenza B by PCR: NEGATIVE
Resp Syncytial Virus by PCR: NEGATIVE
SARS Coronavirus 2 by RT PCR: NEGATIVE

## 2024-06-20 ENCOUNTER — Other Ambulatory Visit: Payer: Self-pay

## 2024-06-20 ENCOUNTER — Emergency Department (HOSPITAL_COMMUNITY): Admission: EM | Admit: 2024-06-20 | Discharge: 2024-06-20 | Disposition: A | Discharge status: Home / Self Care

## 2024-06-20 DIAGNOSIS — S50311A Abrasion of right elbow, initial encounter: Secondary | ICD-10-CM | POA: Insufficient documentation

## 2024-06-20 DIAGNOSIS — S0081XA Abrasion of other part of head, initial encounter: Secondary | ICD-10-CM | POA: Diagnosis not present

## 2024-06-20 DIAGNOSIS — Z23 Encounter for immunization: Secondary | ICD-10-CM | POA: Diagnosis not present

## 2024-06-20 DIAGNOSIS — W1809XA Striking against other object with subsequent fall, initial encounter: Secondary | ICD-10-CM | POA: Insufficient documentation

## 2024-06-20 DIAGNOSIS — F17200 Nicotine dependence, unspecified, uncomplicated: Secondary | ICD-10-CM | POA: Insufficient documentation

## 2024-06-20 DIAGNOSIS — I951 Orthostatic hypotension: Secondary | ICD-10-CM | POA: Insufficient documentation

## 2024-06-20 DIAGNOSIS — R55 Syncope and collapse: Secondary | ICD-10-CM | POA: Diagnosis present

## 2024-06-20 LAB — CBG MONITORING, ED: Glucose-Capillary: 74 mg/dL (ref 70–99)

## 2024-06-20 MED ORDER — TETANUS-DIPHTH-ACELL PERTUSSIS 5-2.5-18.5 LF-MCG/0.5 IM SUSY
0.5000 mL | PREFILLED_SYRINGE | Freq: Once | INTRAMUSCULAR | Status: AC
Start: 2024-06-20 — End: 2024-06-20
  Administered 2024-06-20: 0.5 mL via INTRAMUSCULAR
  Filled 2024-06-20: qty 0.5

## 2024-06-20 NOTE — ED Triage Notes (Signed)
 Patient via EMS after having two ten-second episodes of syncope within five minutes following multiple alcoholic beverages and multiple shots of liquor. Denies pain. A/O x 4.

## 2024-06-20 NOTE — Discharge Instructions (Signed)
 You likely passed out because of lack of PO intake. Make sure you stay well hydrated.

## 2024-06-20 NOTE — ED Provider Notes (Signed)
 [place] EMERGENCY DEPARTMENT AT [facility] Provider Note   CSN: [id number] Arrival date & time: 06/20/24  1757     Patient presents with: Loss of Consciousness   Andrew Mccarty is a 43 y.o. male.   HPI 43 year old male PMH of tobacco use, PNX, and EtOH use presenting to the ED via EMS following 2 syncopal episodes just prior to arrival.  No prehospital interventions.  Patient reports that he was at his niece's house, and consumed several alcoholic beverages and took 2 shots of liquor, soon after he was standing outside leaning against a car, at which time he endorses tunnel vision and collapsing falling forward and hitting his RUE and head without LOC.  He reports hearing voices around him, at which time he was lifted off the ground, and had a second syncopal event.  He denies any processing headache, chest pain, or palpitations.  He reports history of recurrent syncopal episodes following rapid positional changes.  Also of note, patient reports that he has not drink any water today, and his last meal was 0730.    Prior to Admission medications   Medication Sig Start Date End Date Taking? Authorizing Provider  amoxicillin -clavulanate (AUGMENTIN ) 875-125 MG tablet Take 1 tablet by mouth every 12 (twelve) hours. 03/15/23   Horton, Charmaine FALCON, MD  benzonatate  (TESSALON ) 100 MG capsule Take 1 capsule (100 mg total) by mouth every 8 (eight) hours. 12/31/23   Daralene Lonni BIRCH, PA-C  lidocaine  (LIDODERM ) 5 % Place 1 patch onto the skin daily. Remove & Discard patch within 12 hours or as directed by MD 05/13/22   Midge Golas, MD  methocarbamol  (ROBAXIN ) 500 MG tablet Take 1 tablet (500 mg total) by mouth 2 (two) times daily. 05/13/22   Midge Golas, MD  naproxen  (NAPROSYN ) 500 MG tablet Take 1 tablet (500 mg total) by mouth 2 (two) times daily. 12/31/23   Daralene Lonni BIRCH, PA-C  nicotine  (NICODERM CQ  - DOSED IN MG/24 HOURS) 14 mg/24hr patch Place 1 patch (14 mg total)  onto the skin daily. 10/25/21   Gonfa, Taye T, MD  ondansetron  (ZOFRAN -ODT) 4 MG disintegrating tablet Take 1 tablet (4 mg total) by mouth every 8 (eight) hours as needed for nausea or vomiting. 12/31/23   Daralene Lonni BIRCH, PA-C    Allergies: Lactose intolerance (gi) and Morphine  and codeine    Review of Systems  Updated Vital Signs BP 124/84   Pulse 84   Temp 98 F (36.7 C)   Resp 16   SpO2 100%   Physical Exam Constitutional:      Appearance: Normal appearance.  HENT:     Head: Normocephalic.     Comments: X 2 small superficial abrasions overlying right forehead    Right Ear: Ear canal and external ear normal.     Left Ear: Ear canal and external ear normal.     Nose: Nose normal.     Mouth/Throat:     Mouth: Mucous membranes are moist.     Pharynx: Oropharynx is clear.  Eyes:     Extraocular Movements: Extraocular movements intact.     Conjunctiva/sclera: Conjunctivae normal.     Pupils: Pupils are equal, round, and reactive to light.  Cardiovascular:     Rate and Rhythm: Normal rate and regular rhythm.     Pulses: Normal pulses.     Heart sounds: Normal heart sounds. No murmur heard. Pulmonary:     Effort: Pulmonary effort is normal.     Breath sounds:  Normal breath sounds.  Abdominal:     General: Abdomen is flat.     Palpations: Abdomen is soft.  Musculoskeletal:        General: Normal range of motion.     Cervical back: Normal range of motion.     Comments: Scattered superficial abrasions overlying RUE posterior elbow and 2nd and 3rd knuckles without any overlying bony discomfort with full ROM  Skin:    General: Skin is warm and dry.     Capillary Refill: Capillary refill takes 2 to 3 seconds.  Neurological:     General: No focal deficit present.     Mental Status: He is alert and oriented to person, place, and time.     (all labs ordered are listed, but only abnormal results are displayed) Labs Reviewed  CBG MONITORING, ED    EKG: EKG  Interpretation Date/Time:  Saturday June 20 2024 18:22:31 EDT Ventricular Rate:  64 PR Interval:  180 QRS Duration:  92 QT Interval:  450 QTC Calculation: 464 R Axis:   79  Text Interpretation: Normal sinus rhythm Nonspecific ST abnormality Abnormal ECG When compared with ECG of 14-Mar-2023 23:52, No changes compared to prior Confirmed by Simon Rea [phone]) on 06/20/2024 6:24:56 PM  Radiology: No results found.   Procedures   Medications Ordered in the ED  Tdap (BOOSTRIX) injection 0.5 mL (0.5 mLs Intramuscular Given 06/20/24 1841)                                    Medical Decision Making Risk Prescription drug management.   43 year old male presenting to the ED with x 2 episodes of syncope preface by sensation of tunnel vision in the setting of EtOH use and decreased p.o. intake today.  No recent infectious symptoms.  Patient reports history of longstanding orthostatic syncopal episodes.  No postictal state.  No tonic-clonic activity.  No loss of bowel or bladder function.  No tongue lacerations.  On arrival, patient hemodynamically stable in no acute distress.  Normal tensive.  Afebrile.  No tachycardia or tachypnea.  Saturating 100% on RA.  Clinically dehydrated with dry oral mucosa and 2 to 3-second capillary refill.  POC glucose 74.  Patient offered oral rehydration and snacks.  Superficial abrasions overlying anterior right forehead, posterior RUE elbow, and right hand not amenable to sutures.  Full ROM with no evidence of acute bony fracture or dislocation.  No anatomic snuffbox tenderness.  Tetanus updated.  Doubt neurogenic/reflex mediated syncope at this time given absence of noxious stimulus and history.  No recent injuries.  Doubt cardiogenic giving practicing event of tunnel vision and decreased p.o. intake.  No murmurs on auscultation.  EKG reviewed indicative of normal sinus rhythm with normal intervals.  No STEMI.  T WI in V1 and V2.  Unchanged from  prior.  Favored etiology at this time is orthostasis in setting of decreased p.o. intake and mild clinical dehydration with EtOH use.  Patient encouraged p.o. rehydration.  Final diagnoses:  Orthostasis    ED Discharge Orders     None          Esaiah Wanless, Elsie, MD 06/20/24 2132    Simon Rea SAILOR, MD 06/20/24 2324
